# Patient Record
Sex: Female | Born: 1980 | Race: White | Hispanic: No | Smoking: Former smoker
Health system: Southern US, Community
[De-identification: ages and names within clinical notes are randomized; demographics above are authoritative.]

## PROBLEM LIST (undated history)

## (undated) DIAGNOSIS — R519 Headache, unspecified: Secondary | ICD-10-CM

## (undated) DIAGNOSIS — F32A Depression, unspecified: Secondary | ICD-10-CM

## (undated) DIAGNOSIS — F329 Major depressive disorder, single episode, unspecified: Secondary | ICD-10-CM

## (undated) DIAGNOSIS — R51 Headache: Secondary | ICD-10-CM

## (undated) DIAGNOSIS — Z8489 Family history of other specified conditions: Secondary | ICD-10-CM

## (undated) DIAGNOSIS — F419 Anxiety disorder, unspecified: Secondary | ICD-10-CM

## (undated) DIAGNOSIS — D649 Anemia, unspecified: Secondary | ICD-10-CM

## (undated) HISTORY — DX: Major depressive disorder, single episode, unspecified: F32.9

## (undated) HISTORY — DX: Depression, unspecified: F32.A

## (undated) HISTORY — DX: Anxiety disorder, unspecified: F41.9

## (undated) HISTORY — PX: SPINAL CORD STIMULATOR TRIAL: SHX5380

---

## 2002-09-10 ENCOUNTER — Emergency Department (HOSPITAL_COMMUNITY): Admission: EM | Admit: 2002-09-10 | Discharge: 2002-09-10 | Payer: Self-pay | Admitting: Emergency Medicine

## 2002-09-11 ENCOUNTER — Encounter: Payer: Self-pay | Admitting: Emergency Medicine

## 2002-09-15 ENCOUNTER — Ambulatory Visit: Admission: RE | Admit: 2002-09-15 | Discharge: 2002-09-15 | Payer: Self-pay | Admitting: Emergency Medicine

## 2002-09-24 ENCOUNTER — Ambulatory Visit (HOSPITAL_COMMUNITY): Admission: RE | Admit: 2002-09-24 | Discharge: 2002-09-24 | Payer: Self-pay | Admitting: Emergency Medicine

## 2002-12-31 ENCOUNTER — Other Ambulatory Visit: Admission: RE | Admit: 2002-12-31 | Discharge: 2002-12-31 | Payer: Self-pay | Admitting: Obstetrics & Gynecology

## 2003-05-21 ENCOUNTER — Inpatient Hospital Stay (HOSPITAL_COMMUNITY): Admission: AD | Admit: 2003-05-21 | Discharge: 2003-05-23 | Payer: Self-pay | Admitting: Obstetrics and Gynecology

## 2004-11-11 ENCOUNTER — Other Ambulatory Visit: Admission: RE | Admit: 2004-11-11 | Discharge: 2004-11-11 | Payer: Self-pay | Admitting: Obstetrics and Gynecology

## 2004-11-12 ENCOUNTER — Other Ambulatory Visit: Admission: RE | Admit: 2004-11-12 | Discharge: 2004-11-12 | Payer: Self-pay | Admitting: Obstetrics and Gynecology

## 2004-12-04 ENCOUNTER — Encounter: Admission: RE | Admit: 2004-12-04 | Discharge: 2004-12-04 | Payer: Self-pay | Admitting: Orthopedic Surgery

## 2004-12-06 ENCOUNTER — Ambulatory Visit (HOSPITAL_COMMUNITY): Admission: RE | Admit: 2004-12-06 | Discharge: 2004-12-06 | Payer: Self-pay | Admitting: Orthopedic Surgery

## 2004-12-24 ENCOUNTER — Encounter: Admission: RE | Admit: 2004-12-24 | Discharge: 2004-12-24 | Payer: Self-pay | Admitting: Orthopedic Surgery

## 2005-12-23 ENCOUNTER — Other Ambulatory Visit: Admission: RE | Admit: 2005-12-23 | Discharge: 2005-12-23 | Payer: Self-pay | Admitting: Obstetrics and Gynecology

## 2006-07-21 IMAGING — CT CT L SPINE W/ CM
3 of 7 series · 17 of 36 positions shown, 19 images · IV contrast (omnipaque)
Comparison: none

CLINICAL DATA: Low back and right lower extremity pain. No previous lumbar
surgery.

LUMBAR MYELOGRAM:
CT LUMBAR SPINE WITH INTRATHECAL CONTRAST:
TECHNIQUE: An appropriate entry site was determined under fluoroscopy. Skin site
was marked, prepped with Betadine, and draped in usual sterile fashion, and
infiltrated locally with 1% lidocaine. A 22 gauge spinal needle was  advanced
into the thecal sac at L3-L4 from a right interlaminar approach. . Clear
colorless CSF returned. 17 ml Omnipaque 180 were administered intrathecally for
lumbar myelography, followed by axial CT scanning of the lumbar spine. Coronal
and sagittal reconstructions were generated from the axial scan.

[Series 4: recon 3: l-spine helical · axial · 0.27mm/px · z∈[-53,+96]mm · 8 of 155 slices shown, 10 images]
[im 18/155  soft-tissue]
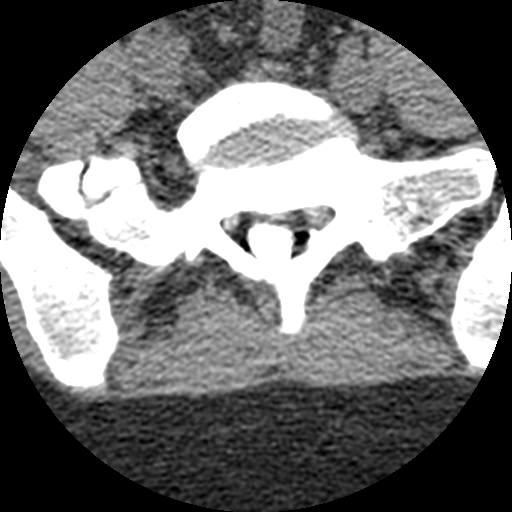
[im 18/155  bone]
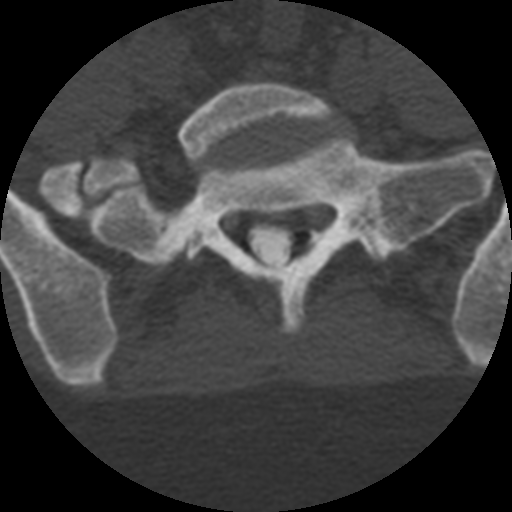
[im 35/155  bone]
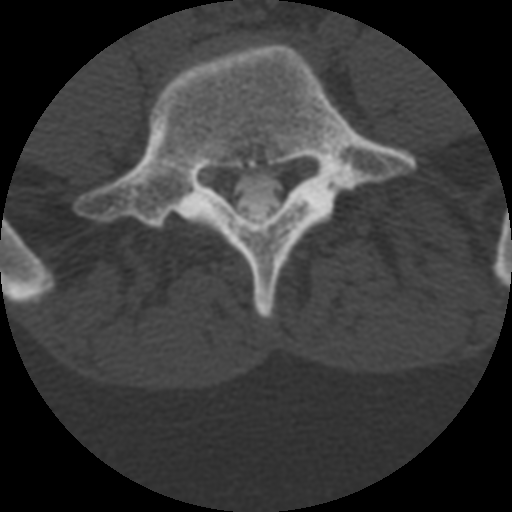
[im 52/155  bone]
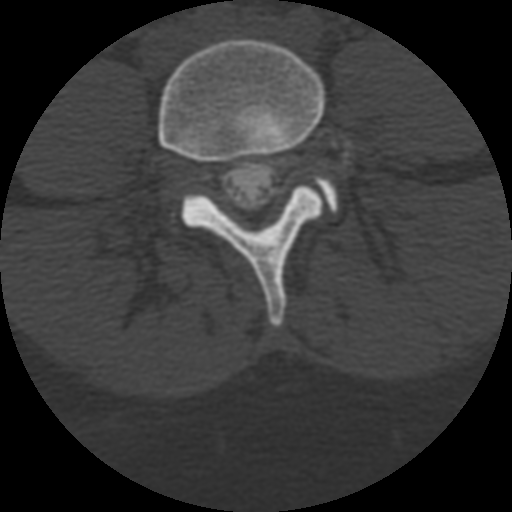
[im 69/155  bone]
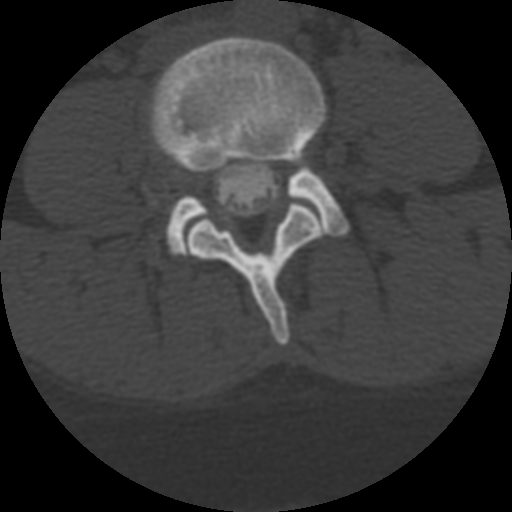
[im 86/155  soft-tissue]
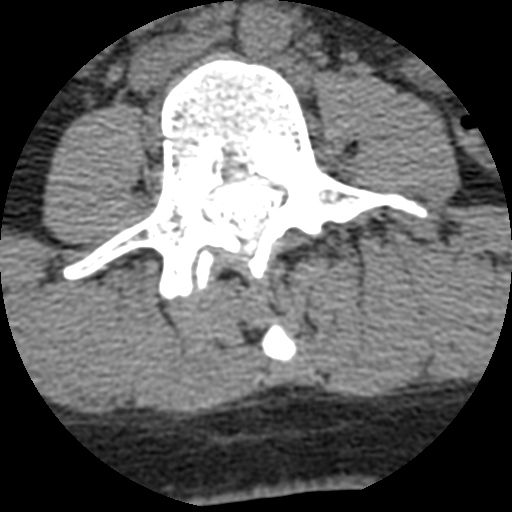
[im 86/155  bone]
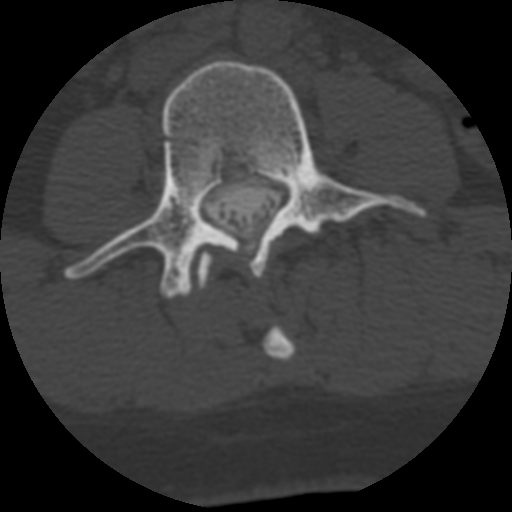
[im 103/155  bone]
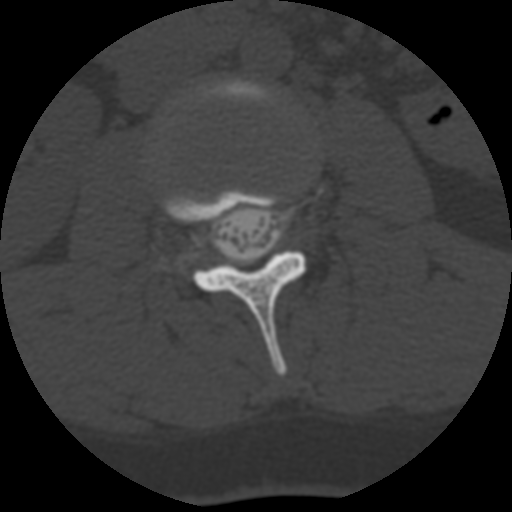
[im 120/155  bone]
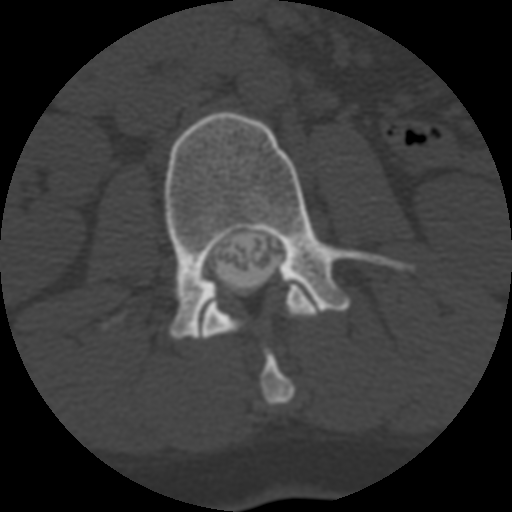
[im 137/155  bone]
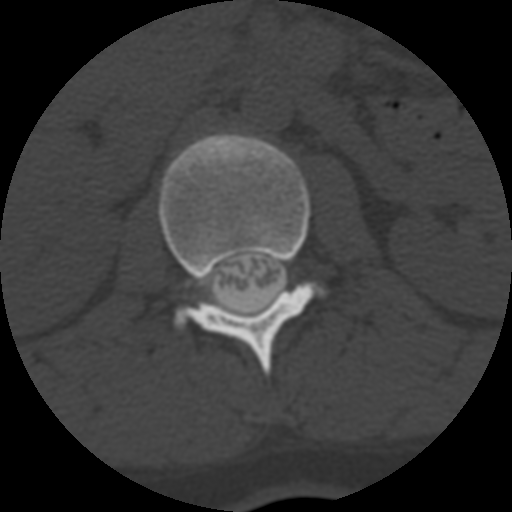

[Series 400: reformatted · sagittal · 0.39mm/px · 6 of 40 slices shown (1 of 2)]
[im 14/40  bone]
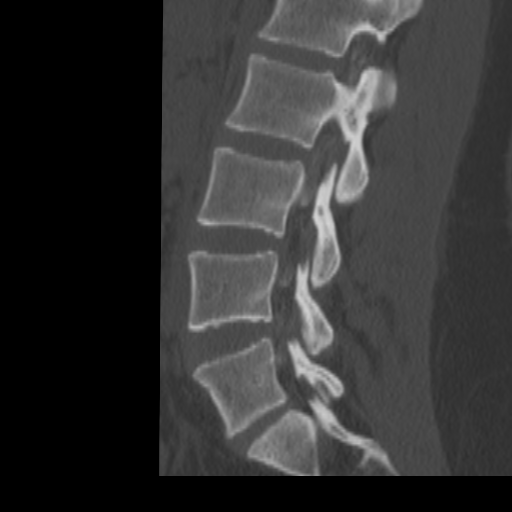
[im 17/40  bone]
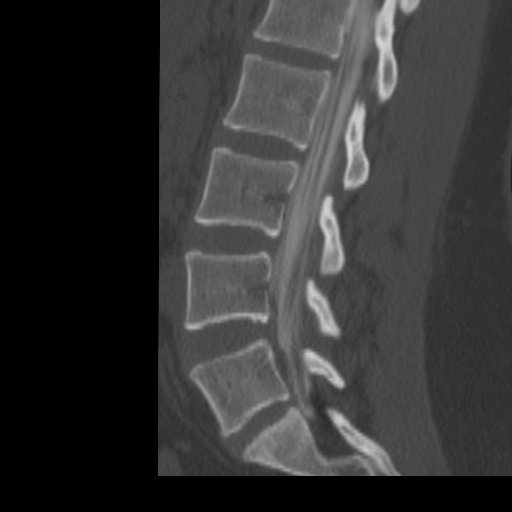
[im 20/40  bone]
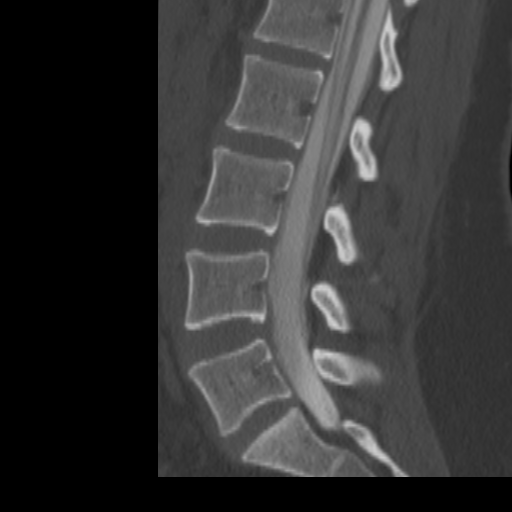
[im 23/40  bone]
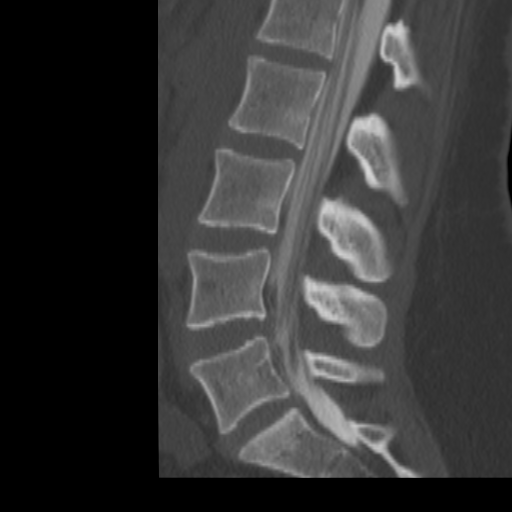
[im 27/40  bone]
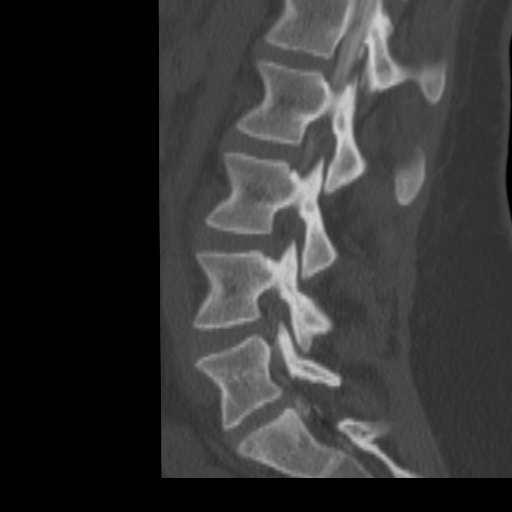
[im 29/40  soft-tissue]
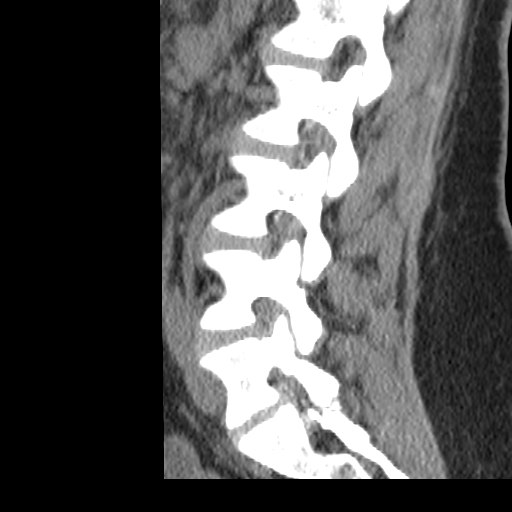

[Series 401: reformatted · coronal · 0.39mm/px · 3 of 40 slices shown (2 of 2)]
[im 8/40  bone]
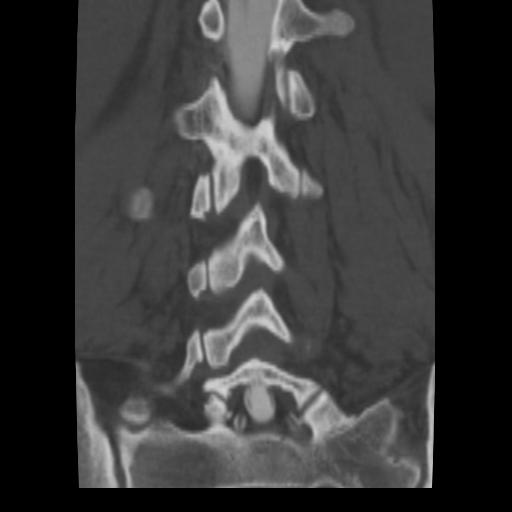
[im 16/40  bone]
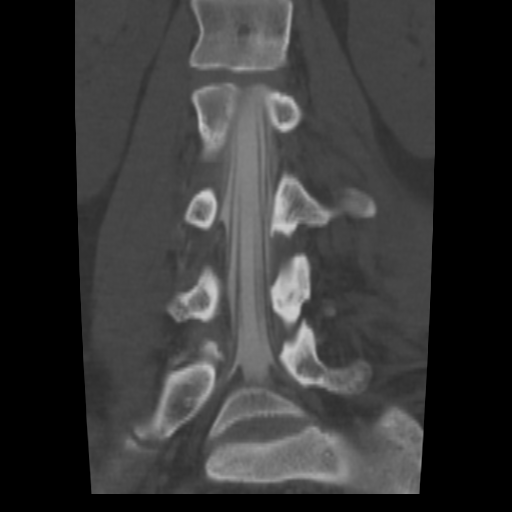
[im 24/40  bone]
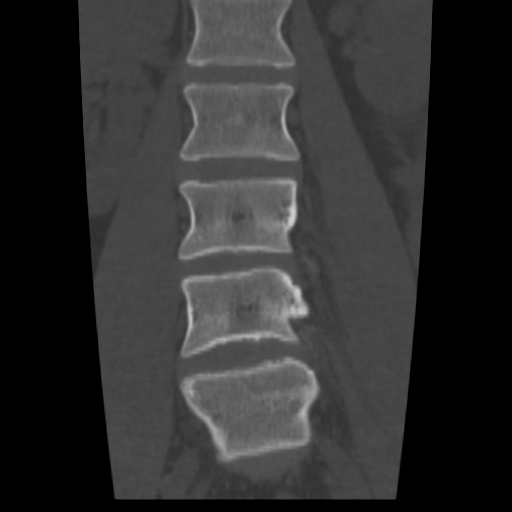

[17 of 36 positions shown; findings below may reference images not displayed]

FINDINGS: 5 nonrib-bearing lumbar segments labeled L1-L5.

T12-L1: Unremarkable

L1-L2: Normal conus terminates behind the L1 vertebral body. Negative for
protrusion, herniation, or stenosis.

L2-L3: Unremarkable

L3-L4: Unremarkable

L4-L5: Mild circumferential disc bulge. No spinal or neural foraminal stenosis.

L5-S1: Small left paracentral protrusion which touches but does not
significantly displace the left S1 nerve root sleeve. There is no nerve root cut
off. Mild degenerative spurring is noted at the inferior margin of the L5-S1
facets bilaterally.
IMPRESSION: 1. Mild bulge L4-L5 without evidence of compressive pathology.
2. Small left paracentral protrusion L5-S1 which contacts but does not displace
the left S1 nerve root sleeve.
3. Early facet degenerative changes bilaterally L5-S1

## 2006-07-23 IMAGING — RF DG FLUORO GUIDE NDL PLC/BX
2 series · 2 of 2 positions shown · non-contrast
Comparison: none

CLINICAL DATA: Positional headache and nausea persisting for 2 days post
myelogram despite conservative treatment.

[Series 1: run · 1 of 1 slices shown (1 of 2)]
[im 1/1]
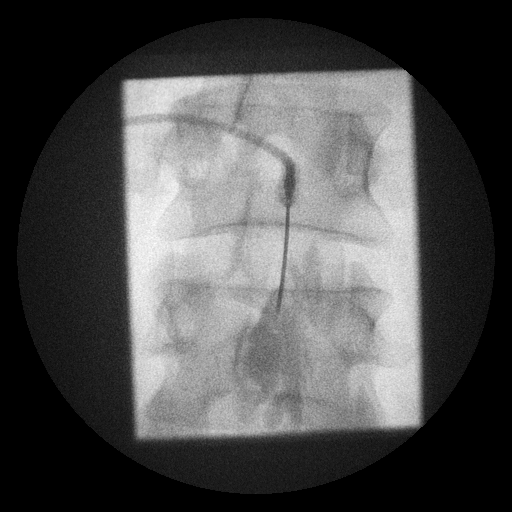

[Series 2: run · 1 of 1 slices shown (2 of 2)]
[im 1/1]
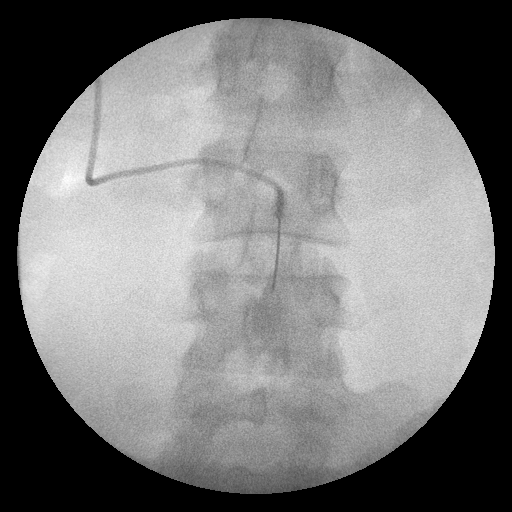

[2 of 2 positions shown; findings below may reference images not displayed]

Lumbar epidurogram and blood patch:

Overlying skin prepped with Betadine, draped in usual sterile fashion,
infiltrated locally with 1% lidocaine. 20 gauge spinal needle advanced into the
posterior epidural space on the right at L3-L4 using an interlaminar approach
with loss of resistance technique. Diagnostic injection of 3 ml 7mnipaque-0AM
opacifies the epidural space without intravascular or subarachnoid component. 15
ml autologous blood were administered as lumbar epidural blood patch. No
immediate complication.
IMPRESSION: 1. Technically successful lumbar epidural blood patch. The patient was counseled
on the  importance of lying flat for additional 24 hours and maintaining good
p.o. fluid intake. She was given a prescription for Vicodin  #24 1-2 p.o.
q.4-6h. p.r.n. pain, no refills.

## 2007-05-26 ENCOUNTER — Inpatient Hospital Stay (HOSPITAL_COMMUNITY): Admission: AD | Admit: 2007-05-26 | Discharge: 2007-05-26 | Payer: Self-pay | Admitting: Obstetrics and Gynecology

## 2007-05-27 ENCOUNTER — Inpatient Hospital Stay (HOSPITAL_COMMUNITY): Admission: AD | Admit: 2007-05-27 | Discharge: 2007-05-27 | Payer: Self-pay | Admitting: Obstetrics and Gynecology

## 2007-08-18 ENCOUNTER — Observation Stay (HOSPITAL_COMMUNITY): Admission: AD | Admit: 2007-08-18 | Discharge: 2007-08-19 | Payer: Self-pay | Admitting: Obstetrics and Gynecology

## 2007-11-09 ENCOUNTER — Ambulatory Visit (HOSPITAL_COMMUNITY): Admission: RE | Admit: 2007-11-09 | Discharge: 2007-11-09 | Payer: Self-pay | Admitting: Obstetrics and Gynecology

## 2008-01-29 ENCOUNTER — Inpatient Hospital Stay (HOSPITAL_COMMUNITY): Admission: AD | Admit: 2008-01-29 | Discharge: 2008-01-31 | Payer: Self-pay | Admitting: Obstetrics and Gynecology

## 2008-07-26 DIAGNOSIS — R112 Nausea with vomiting, unspecified: Secondary | ICD-10-CM | POA: Insufficient documentation

## 2008-07-31 ENCOUNTER — Ambulatory Visit: Payer: Self-pay | Admitting: Internal Medicine

## 2008-07-31 DIAGNOSIS — R1033 Periumbilical pain: Secondary | ICD-10-CM | POA: Insufficient documentation

## 2008-07-31 DIAGNOSIS — F319 Bipolar disorder, unspecified: Secondary | ICD-10-CM | POA: Insufficient documentation

## 2008-07-31 DIAGNOSIS — R197 Diarrhea, unspecified: Secondary | ICD-10-CM

## 2008-07-31 LAB — CONVERTED CEMR LAB
ALT: 19 units/L (ref 0–35)
Albumin: 3.7 g/dL (ref 3.5–5.2)
Alkaline Phosphatase: 67 units/L (ref 39–117)
Basophils Relative: 0.4 % (ref 0.0–3.0)
Calcium: 9.1 mg/dL (ref 8.4–10.5)
Chloride: 107 meq/L (ref 96–112)
Creatinine, Ser: 0.7 mg/dL (ref 0.4–1.2)
Eosinophils Relative: 1.3 % (ref 0.0–5.0)
GFR calc Af Amer: 132 mL/min
GFR calc non Af Amer: 109 mL/min
HCT: 34.5 % — ABNORMAL LOW (ref 36.0–46.0)
Neutro Abs: 3.9 10*3/uL (ref 1.4–7.7)
Neutrophils Relative %: 66.1 % (ref 43.0–77.0)
Platelets: 202 10*3/uL (ref 150–400)
Sed Rate: 27 mm/hr — ABNORMAL HIGH (ref 0–22)
Total Protein: 7.1 g/dL (ref 6.0–8.3)
Transferrin: 262.8 mg/dL (ref >=212.0)
WBC: 5.8 10*3/uL (ref 4.5–10.5)

## 2008-08-02 ENCOUNTER — Encounter: Payer: Self-pay | Admitting: Internal Medicine

## 2008-08-06 ENCOUNTER — Encounter: Payer: Self-pay | Admitting: Internal Medicine

## 2010-04-03 ENCOUNTER — Emergency Department (HOSPITAL_COMMUNITY): Admission: EM | Admit: 2010-04-03 | Discharge: 2010-04-04 | Payer: Self-pay | Admitting: Emergency Medicine

## 2010-10-27 NOTE — Assessment & Plan Note (Signed)
Summary: NAUSEA/DIARRHEA/FH   History of Present Illness Visit Type: self referral Primary GI MD: Lina Sar MD Primary Provider: Dr. Carmie Kanner Requesting Provider: n/a Chief Complaint: abdominal pain,nausea and vomiting has been on and off for about 9 years History of Present Illness:   This is a 30 year old white female referred by her mother who is a patient of ours  with Crohn's disease,  for evaluation of diarrhea and abdominal pain. The patient has been complaining of intermittent central abdominal pain for the past 9 years at times associated with diarrhea. She denies constipation. There has been occasional rectal bleeding. Her weight has been excessive. In spite of having abdominal pain, she has not missed any work. She takes Imodium p.r.n. Patient has diagnosis of bipolar disorder by her psychiatrist Dr Evelene Croon  in [place] and most recently in [place]. She was put on Seroquel but has not taken it because it makes her sleepy. Patient denies aphthous stomatitis, low back pain, history of gallbladder disease. She has never seen a gastroenterologist.   GI Review of Systems    Reports abdominal pain, acid reflux, belching, bloating, chest pain, loss of appetite, nausea, vomiting, weight loss, and  weight gain.      Denies dysphagia with liquids, dysphagia with solids, heartburn, and  vomiting blood.      Reports diarrhea.     Denies anal fissure, black tarry stools, change in bowel habit, constipation, diverticulosis, fecal incontinence, heme positive stool, hemorrhoids, irritable bowel syndrome, jaundice, light color stool, liver problems, rectal bleeding, and  rectal pain.     Prior Medications Reviewed Using: Patient Recall  Updated Prior Medication List: * BIRTH CONTROL 1 tablet once daily for 28 days  Current Allergies (reviewed today): No known allergies   Past Medical History:    Reviewed history from 07/26/2008 and no changes required:       Current Problems:   NAUSEA AND VOMITING (ICD-787.01)       Anemia       Anxiety Disorder       Asthma       Chronic Headaches       Depression       Hypertension       Sleep Apnea  Past Surgical History:    Reviewed history and no changes required:       Unremarkable   Family History:    Reviewed history and no changes required:       Family History of Colon Polyps: mother, grandfather       Family History of Colitis/Crohn's: Mother       Family History of Irritable Bowel Syndrome:Mother       Family History of Colon Cancer: ? Not Sure  Social History:    Reviewed history and no changes required:       Occupation: Production designer, theatre/television/film       Patient is a former smoker.  Quit 2008       Daily Caffeine Use       Illicit Drug Use - no   Risk Factors:  Tobacco use:  quit Drug use:  no    Vital Signs:  Patient Profile:   30 Years Old Female Height:     67 inches Weight:      221.38 pounds BMI:     34.80 Pulse rate:   88 / minute Pulse rhythm:   regular BP sitting:   122 / 82  (left arm)  Vitals Entered By: Merri Ray CMA (July 31, 2008 10:35 AM)  Physical Exam  General:     obese, alert, oriented and cooperative. Mouth:     No deformity or lesions, dentition normal. Neck:     Supple; no masses or thyromegaly. Lungs:     Clear throughout to auscultation. Heart:     Regular rate and rhythm; no murmurs, rubs,  or bruits. Abdomen:     large abdomen with striea . Normal active bowel sounds, diffusely tender; more so in the left and right lower quadrants. Liver edge at costal margin, no epigastric tenderness. No palpable mass. Rectal:     patient reluctantly agreed to rectal exam. Perianal area with normal rectal tone, no stool specimen obtained, mucous was hemoccult negative. Additional Exam:     she had multiple tattoos on the presacral area and upper extremities    Impression & Recommendations:  Problem # 1:  ABDOMINAL PAIN, PERIUMBILICAL  (ICD-789.05) intermittent chronic periumbilical abdominal pain associated with diarrhea on the backgroung of a family history of Crohn's disease in her mother who is our patient. She has issues with anxiety and depression but is on no medications. Her symptoms may be related to severe irritable bowel syndrome or possibly due to inflammatory bowel disease. I've had a discussion with her about the possibility of her having Crohn's disease. She agrees to undergo a workup although she is somewhat reluctant. I believe her mother made her come to see me against her wishes because of the concern for Crohn's disease. We will proceed with a CT scan of the abdomen and pelvis with IV and oral contrast and put  her on a trial of Bentyl 20 mg p.r.n. abdominal pain. We will obtain labs. Orders: CT Abdomen/Pelvis w & w/o Contrast (CT A/P w&w/o Cont) TLB-CBC Platelet - w/Differential (85025-CBCD) TLB-IBC Pnl (Iron/FE;Transferrin) (83550-IBC) TLB-CMP (Comprehensive Metabolic Pnl) (80053-COMP) TLB-Sedimentation Rate (ESR) (85651-ESR)   Problem # 2:  BIPOLAR DISORDER UNSPECIFIED (ICD-296.80) pt not taking prescribed medication Seroquel   Patient Instructions: 1)  CT scan of the abdomen and pelvis 2)  Bentyl 20 mg p.o. b.i.d. 3)  CBC, CMET, sedimentation rate, iron studies today 4)  Copy Sent To:Dr Irvin 5)   consider colonoscopy depending on CT scan results    Prescriptions: BENTYL 20 MG TABS (DICYCLOMINE HCL) Take 1 tablet by mouth two times a day  #60 x 3   Entered by:   Hortense Ramal CMA   Authorized by:   Hart Carwin MD   Signed by:   Hortense Ramal CMA on 07/31/2008   Method used:   Electronically to        [facility]. [id number]* (retail)       [address]       [place], [place]  [address]       Ph: [phone] or [phone]       Fax: [phone]   RxID:   [id number]  ]

## 2010-12-13 LAB — BASIC METABOLIC PANEL
CO2: 28 mEq/L (ref 19–32)
Calcium: 9 mg/dL (ref 8.4–10.5)
Chloride: 103 mEq/L (ref 96–112)
Creatinine, Ser: 0.78 mg/dL (ref 0.4–1.2)
GFR calc Af Amer: >60 mL/min (ref >60)
GFR calc non Af Amer: >60 mL/min (ref >60)
Glucose, Bld: 93 mg/dL (ref 70–99)
Sodium: 137 mEq/L (ref 135–145)

## 2010-12-13 LAB — POCT CARDIAC MARKERS
CKMB, poc: <1 ng/mL — ABNORMAL LOW (ref 1.0–8.0)
Myoglobin, poc: 32.8 ng/mL (ref 12–200)
Troponin i, poc: <0.05 ng/mL (ref 0.00–0.09)

## 2010-12-13 LAB — CBC
HCT: 40.4 % (ref 36.0–46.0)
MCH: 30.4 pg (ref 26.0–34.0)
MCV: 90.8 fL (ref 78.0–100.0)
Platelets: 199 10*3/uL (ref 150–400)
WBC: 6.7 10*3/uL (ref 4.0–10.5)

## 2010-12-13 LAB — DIFFERENTIAL
Basophils Relative: 0 % (ref 0–1)
Eosinophils Relative: 1 % (ref 0–5)
Lymphocytes Relative: 22 % (ref 12–46)

## 2010-12-13 LAB — URINALYSIS, ROUTINE W REFLEX MICROSCOPIC
Bilirubin Urine: NEGATIVE
Hgb urine dipstick: NEGATIVE
Specific Gravity, Urine: 1.028 (ref 1.005–1.030)
Urobilinogen, UA: 0.2 mg/dL (ref 0.0–1.0)

## 2010-12-13 LAB — POCT PREGNANCY, URINE: Preg Test, Ur: NEGATIVE

## 2010-12-13 LAB — D-DIMER, QUANTITATIVE: D-Dimer, Quant: 0.39 ug/mL-FEU (ref 0.00–0.48)

## 2011-01-25 ENCOUNTER — Other Ambulatory Visit: Payer: Self-pay | Admitting: Orthopedic Surgery

## 2011-01-25 ENCOUNTER — Ambulatory Visit
Admission: RE | Admit: 2011-01-25 | Discharge: 2011-01-25 | Disposition: A | Discharge status: Home / Self Care | Payer: Self-pay | Source: Ambulatory Visit | Attending: Orthopedic Surgery | Admitting: Orthopedic Surgery

## 2011-01-25 DIAGNOSIS — M545 Low back pain, unspecified: Secondary | ICD-10-CM

## 2011-01-25 DIAGNOSIS — M542 Cervicalgia: Secondary | ICD-10-CM

## 2011-01-25 DIAGNOSIS — T1490XA Injury, unspecified, initial encounter: Secondary | ICD-10-CM

## 2011-02-12 NOTE — Discharge Summary (Signed)
[name], [name]               ACCOUNT NO.:  [id number]   MEDICAL RECORD NO.:  [id number]          PATIENT TYPE:  OBV   LOCATION:  9305                          FACILITY:  [facility]   PHYSICIAN:  Carrington Clamp, M.D. DATE OF BIRTH:  1981/07/06   DATE OF ADMISSION:  08/18/2007  DATE OF DISCHARGE:  08/19/2007                               DISCHARGE SUMMARY   FINAL DIAGNOSES:  1. Intrauterine gestation at 18 weeks.  2. Hyperemesis and dehydration.   COMPLICATIONS:  None.   This 24-hour-old, G2, P1-0-0-1 presents at 18 weeks with persistent  nausea and vomiting.  The patient has been on oral Zofran and Phenergan,  still having problems at this time.  No urinary complaints.  Upon exam,  fetal heart tones were present, about 158 beats per minute.  Labs were  obtained.  No ketones or protein in her urine.  She was admitted because  there was no room in the MAU for some fluids.  The patient also was  noted to have a low sodium which was repleted.  She responded to some  Phenergan was started on some Macrobid for a mild urinary tract  infection.  The patient was feeling better by hospital day #1.  She was  felt ready for discharge, was [place] to tolerate a Brat diet, was sent  home with her Phenergan and Zofran and to use as directed with Fioricet  1 to 2 every 4 hours as needed for her headache, to continue her  vitamins and Prozac as she was taking prior.  She was also to continue  the rest of her Macrobid 100 mg twice a day and some Pepcid OTC to help  with some of the reflux.  She is to call our office of course with any  problems and to follow up with her routine office visit.   DISCHARGE LABORATORY DATA:  The patient had a hemoglobin of 11.1, white  blood cell count of 6.6.  She did have a mild urinary tract infection  that was treated with the Macrobid.      Vicki Shelton, P.A.-C.      Carrington Clamp, M.D.  Electronically Signed    MB/MEDQ  D:  09/15/2007  T:   09/16/2007  Job:  [id number]

## 2011-07-06 LAB — BASIC METABOLIC PANEL
CO2: 25
Calcium: 8 — ABNORMAL LOW
GFR calc Af Amer: >60
GFR calc non Af Amer: >60
Glucose, Bld: 93

## 2011-07-06 LAB — URINE MICROSCOPIC-ADD ON

## 2011-07-06 LAB — COMPREHENSIVE METABOLIC PANEL
Albumin: 3.1 — ABNORMAL LOW
BUN: 7
CO2: 25
GFR calc Af Amer: >60
GFR calc non Af Amer: >60
Glucose, Bld: 97

## 2011-07-06 LAB — CBC
MCHC: 34.8
Platelets: 192
RBC: 3.63 — ABNORMAL LOW
RDW: 14
WBC: 8.2

## 2011-07-06 LAB — URINALYSIS, ROUTINE W REFLEX MICROSCOPIC
Bilirubin Urine: NEGATIVE
Nitrite: NEGATIVE

## 2011-07-09 LAB — URINALYSIS, ROUTINE W REFLEX MICROSCOPIC
Bilirubin Urine: NEGATIVE
Ketones, ur: NEGATIVE
Leukocytes, UA: NEGATIVE
Nitrite: NEGATIVE
Specific Gravity, Urine: 1.015
Urobilinogen, UA: 1

## 2011-07-09 LAB — CBC
HCT: 35.1 — ABNORMAL LOW
MCV: 88.2
Platelets: 238
RBC: 3.98
RDW: 13.4
WBC: 6.6

## 2011-07-09 LAB — URINE MICROSCOPIC-ADD ON

## 2011-07-09 LAB — ABO/RH: ABO/RH(D): B POS

## 2011-07-09 LAB — RPR: RPR Ser Ql: NONREACTIVE

## 2011-07-09 LAB — POCT PREGNANCY, URINE: Operator id: 140111

## 2014-08-02 ENCOUNTER — Encounter: Payer: Self-pay | Admitting: Physical Medicine & Rehabilitation

## 2014-08-02 ENCOUNTER — Encounter
Payer: Worker's Compensation | Attending: Physical Medicine & Rehabilitation | Admitting: Physical Medicine & Rehabilitation

## 2014-08-02 VITALS — BP 138/76 | HR 76 | Resp 14 | Ht 66.0 in | Wt 209.0 lb

## 2014-08-02 DIAGNOSIS — M5481 Occipital neuralgia: Secondary | ICD-10-CM

## 2014-08-02 DIAGNOSIS — M47816 Spondylosis without myelopathy or radiculopathy, lumbar region: Secondary | ICD-10-CM

## 2014-08-02 NOTE — Progress Notes (Signed)
Subjective:    Shelton ID: Vicki Shelton, female    DOB: 01/30/1981, 33 y.o.   MRN: [id number]   IME Shelton: Vicki Shelton Date of Birth: 02/09/1981 Date of Exam and Report: 08/02/2014     HPI History of Present Illness: Ms. Vicki Shelton is a 33 year old white female who was injured on 01/08/11 while working for [employer]. The Shelton states that while she was unloading stock off a pallet, the stock/pallet fell back on her, and she was driven backwards, ultimately pinned between two pallets. She presented to [facility] in [place], [place] that afternoon and complained of pain and tingling in her mid back. Dr. Wyline Copasonald Peeler treated her and noted that her lumbar flexion was decreased but notes no tenderness, deformities, or other positive findings. Right hip xray was read as negative.  He diagnosed her with a lumbar sprain, and she was given Vicodin, Robaxin, and Diclofenac. The Shelton reports that she had mild headache at this time, but that the headache was mild in comparison to the back pain she was having.  Three days later, the Shelton returned to [facility] for a follow up visit as recommended by Dr. Marca AnconaPeeler. At this visit, she complained of neck pain, mid and low back pain, as well as weakness in her right arm. She was diagnosed with "cervico thoracic lumbar pain, right arm/hand neuropathy, right/arm hand weakness, right leg/foot neuropathy. She was given a medrol dose pack in addition to vicodin and robaxin. A referral was [facility] to [facility].   On 01/25/11, the Shelton was seen by Dr. Venita Lickahari Shelton. He notes that the Shelton was thrown backwards when she was lowering a pallet and felt a "snap in both the neck and the low back." Pain was noted with axial compression and traction. Weakness and sensory loss was noted in the right arm. Straight leg testing was positive on the right. Given the Shelton's weakness in the right arm and leg, Dr. Shon BatonBrooks ordered an MRI  of the cervical and lumbar spine. The cervical MRI was read as unremarkable except for mild degenerative disk disease with central protrusions and disk-osteophyte complexes at C5-6 and C6-7.  The lumbar MRI was read as unremarkable except for a transitional L5 vertebra. No central or foraminal stenosis was noted in the lumbar spine.  Dr. Shon BatonBrooks saw the Shelton back on 02/26/11 and recommended non-surgical intervention. He ordered a medrol dose pack, mobic, norco, and PT.   The Shelton returned to Dr. Shon BatonBrooks on 03/30/11 without improvement in pain. Pain was reported as significant in the low back, right buttocks, and right leg. Given the transitional L5 vertebra, he felt that this might be the source of her low back and right leg pain and ordered an injection of the pseudoarticulation between L5 and the sacrum on the right. On 05/03/11, the Shelton complained of a 3 week history of increasing headaches.  Low back pain was similar. FABER test was reported positive.   On 06/16/11, the Shelton saw Dr. Ethelene Halamos who injected the right sided pseudoarticulation, and he also performed bilateral greater occipital nerve blocks. The lumbar injection provided no relief, but the Shelton reported improvement in her headaches after the GON blocks.   Dr. Shon BatonBrooks saw the Shelton again on 06/29/2011 at which time he recommended pain management and an FCE. The FCE was performed on 08/04/2011, findings of which "suggested that Vicki Shelton' subjective reports of pain and associated limitation to be unreliable and inaccurate. Due  to the Shelton's self-limiting behaviors during the tests, it should be taken into consideration that she may be capable of performing at a higher level than the results suggest."  Sedentary work restrictions were recommended.  Dr. Shon Baton "put her into a light work category" and gave her a 3% impairment rating. He stated that there was nothing further he could for the Shelton at that point.   From that point  forward, Dr. Ethelene Hal has been the Shelton's main provider and has taken over the "pain management" of Vicki Shelton. Numerous blocks have been performed including repeat greater occipital nerve blocks, neural prolotherapy along the cervical sensory nerves, right L4-5 LESI, caudal block, right L3,L4 MBB with right L5 dorsal ramus block, RF neurotomy at same levels of MBB/dorsal ramus blocks, and trigger point injections.  As a whole, the Shelton appears to have experienced fair/temporary results with the lumbar interventions while the cervical/occipital injections were more beneficial, but still temporary in effect. None of these interventions provided long term significant relief, and the Shelton continues to complain of substantial pain. She has physical therapy which provided equivocal benefit.   The last notes I have from orthopedics are from April 05, 2014. She has seen Ramos since then however, including a visit for Lumbar RF's (performed in September) which provided some relief.  She may have had a trigger point injection also. Dr. Ethelene Hal has deferred any further interventions for her neck/headaches to Dr. Asa Lente whom he referred the Shelton to back in July.   A follow up MRI was ultimately performed on 05/11/14 which revealed no substantial changes from the initial MRI from 2012.    Regarding her current symptoms and presentation:   Low back:  her pain is worst with standing. The pain is most prominent from her right low back to her mid sacrum. She can stand for about 10 minutes before it sets in, and she usually has to sit down within 30 minutes. It will also bother if she sits in the car for a prolonged period of time. Additionally, it's difficult to stand after she sits. Sleeping is difficult. Typically she finds a prone position with a pillow as the most comfortable position to sleep in. Her sleep is broken and 4-6 hours per night. For stretching she does flexion exercises, lunges,  and other  ROM activities which seem to help. She tries to do these daily. She also likes to walk. She can walk for about a 1/2 mile before her back starts to hurt.  Currently she rates her back pain at a 5/10. She uses norco and alleve to help relieve her back pain. She tries to use just alleve on the good days but uses up two of the norco for days when the pain is bad.   Headaches and cervicalgia: Her headaches have gradually increased over the last 3 years. They were not too bad initially, and she states her back was more of a focus. The headaches seem to start in her upper neck and radiate over the occipital region into the posterior parietal area. The pain is more severe on the right side. She also experiences discomfort behind her eyes. The headaches usually take place on a daily basis and will last for a few hours. She does report mild photosensitivity, but it's not severe. She denies any other associated symptoms. She denies aura or warning signs that a headache is coming on. There has been no pattern to the timing of these headaches. This morning she woke  up with a headache. Her headache is rated a 5-6/10 this morning. To relieve a headache, she takes extra strength excedrin and then lays down--- typically 30-45 minutes later she has relief.    Pain Inventory Average Pain 7 Pain Right Now 5 My pain is intermittent, sharp, stabbing and aching  In the last 24 hours, has pain interfered with the following? General activity 10 Relation with others 10 Enjoyment of life 10 What TIME of day is your pain at its worst? any time Sleep (in general) Poor  Pain is worse with: unsure Pain improves with: rest, medication and injections Relief from Meds: 3  Mobility walk without assistance ability to climb steps?  yes do you drive?  yes  Function not employed: date last employed 01/08/2011  Neuro/Psych weakness numbness tingling trouble walking dizziness depression  Prior Studies Any changes  since last visit?  no  Physicians involved in your care Any changes since last visit?  no   Family History  Problem Relation Age of Onset  . Heart disease Paternal Grandmother    History   Social History  . Marital Status: Divorced    Spouse Name: N/A    Number of Children: N/A  . Years of Education: N/A   Social History Main Topics  . Smoking status: Former Smoker -- 1.00 packs/day    Types: Cigarettes  . Smokeless tobacco: None  . Alcohol Use: 0.0 oz/week    0 Not specified, 0 Glasses of wine per week  . Drug Use: None  . Sexual Activity: None   Other Topics Concern  . None   Social History Narrative  . None   History reviewed. No pertinent past surgical history. Past Medical History  Diagnosis Date  . Depression   . Anxiety    BP 138/76 mmHg  Pulse 76  Resp 14  Ht 5\' 6"  (1.676 m)  Wt 209 lb (94.802 kg)  BMI 33.75 kg/m2  SpO2 100%  Opioid Risk Score:   Fall Risk Score: Low Fall Risk (0-5 points)  Review of Systems  HENT: Negative for dental problem.   Cardiovascular: Negative for chest pain.  Gastrointestinal: Negative for abdominal pain and abdominal distention.  Endocrine: Negative for cold intolerance and heat intolerance.  Musculoskeletal: Positive for myalgias, back pain, gait problem, neck pain and neck stiffness.  Neurological: Positive for dizziness, weakness and headaches.  Psychiatric/Behavioral: Positive for dysphoric mood. The Shelton is nervous/anxious.        Objective:   Physical Exam  General: Alert and oriented x 3, No apparent distress. She's slightly overweight. HEENT: Head is normocephalic, atraumatic, PERRLA, EOMI, sclera anicteric, oral mucosa pink and moist, dentition good, ext ear canals clear,  Neck: Supple without JVD or lymphadenopathy Heart: Reg rate  Chest: normal respirations, no distress Abdomen: Soft, non-tender, non-distended Extremities: No clubbing, cyanosis, or edema.  Skin: Clean and intact without signs of  breakdown Neuro: Pt is cognitively appropriate with normal insight, memory, and awareness. Cranial nerves 2-12 are intact. Sensory exam is normal. Reflexes are 2+ in all 4's. Fine motor coordination is intact. No tremors. Motor function is grossly 5/5 in the UE's. RLE was 4- to 4+/5 with HF, KE/KF with some give away weakness and inconsistency noted. Right HAD was 4+, HAB 4 with some variability. Right ADF/APF 4+ to 5/5. LLE was grossly 5/5 proximal to distal.   Musculoskeletal: Full   AROM and PROM in the neck. Right lateral pending seemed to irritate her right occipital scalp and superior cervical  region. She had some discomfort with rightward rotation of the neck also. Other cervical movements proved less provocative from a pain standpoint. She experienced pain with palpation over the posterior neck 1-2 inches right of midline just below and the occipital ridge. There was additional tenderness above the ridge as well.  I also noted tenderness on the left in these areas, but to a much lesser extent. Spurling's test was negative on either side. Axial loading and decompression did not appear to exacerbate her pain. Shoulder, neck and head posture were fairly appropriate. No spasm or myofascial abnormalities were detected in the neck or shoulder girdles. Rotator cuff testing was normal.  From a standpoint of her low back, she was notable for slight elevation of her right hemipelvis which corrected with verbal cueing. She walked with antalgia on the right leg--she stated that weight bearing caused her upper leg and low back/buttock area to be tender. On palpation she was notable for prominence of the right lumbar paraspinal musculature with slight rotation of the spine in a clockwise manner (to the right, 5 degrees). The right lumbar paraspinals were tender from L3 down to the iliac crest. Both PSIS's were tender to palpation, especially on the right. Greater trochs were non-tender. Right SLR was notable for pain  provocation in the right low back predominantly with pain also in the posterior leg due to hamstring tightness.  Left SLR provoked a mild increase in her low back pain. Hamstrings were tight once again. FABER test was negative bilaterally (and actually "felt good" per Shelton). Seated slump test caused increased low back pain on the right, not as much on the left. She was able to bend to about 85 degrees in forward flexion at the waist before pain and tightness prohibitited her from going farther. She had difficulty returning to a neutral standing position after bending---needed extra time to achieve.  She has mild pain with lateral bending to the right more than left. Lumbar rotation did not appear provoke pain. Extension caused right low back discomfort but didn't appear to be overly limited. Lumbar Facet maneuver was negative on the left and positive on the right, reproducing her right low back pain. No obvious scoliosis was appreciated. Her hips, knees, and feet were normal in respect to color, temperature, ROM. There were no obvious signs of symptom exaggeration during any portion of the examination.   Psych: Pt's affect is appropriate. Pt is cooperative. She did not appear depressed.         Assessment & Recommendations:    1. Diagnoses: A. Greater Occipital Neuralgia, right more affected than left. B. 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 Causality: It is entirely plausible that both of these diagnoses are related to the injury on 12/29/2010 given the mechanism of the injury where she was thrown backwards and pinned between two pallets---thus causing impact and whiplash-like trauma to her head/neck and low back.  The Shelton reports pain in both areas after the injury even though her headaches were not noted by the MD at the time of the initial urgent care evaluation. Even though her lumbar imaging is not overwhelming for pathology, her  exam is consistent, and I saw no signs of symptom exaggeration. With facet arthropathy, it is not uncommon for an MRI to appear "normal" while clinically the Shelton displays facet signs.    3. Appropriateness of Care: Although I have some recommendations (as I will state below), Vicki Shelton'  care has been appropriate for her given diagnoses.  4. Additional recommendations for treatment:  Lumbar facet arthropathy/low back pain:  A. A focused trial of Pilates-based physical therapy to help reduce core muscle spasm, lengthen core musculature, strengthen core musculature, and to develop a customized home exercise program which the Shelton needs to follow through with on a daily basis. Shelton needs to practice fundamental Pilates principles on a daily basis as they pertain to her posture and technique. B. Scheduled trial of a muscle relaxant for at least a period of 2-3 months to reduce spasm. C. Scheduled prescription NSAID for at least a period of 2-3 months. D. Follow up lumbar RF's as appropriate  Occipital Neuralgia: 1. Trial of tricyclic antidepressant such as amitriptyline or nortriptyline 2. Trial of anticonvulsant such as topamax, gabapentin, or depakote 3. Scheduled NSAID trial for 2-3 months. Re-trial of oral steroids? 4. Physical therapy to address cervical/scalp massage, desensitization, modalities for pain relief 5. TENS unit trial 6. It would appropriate to see a headache specialist such as Dr. Vela Prose for further recommendations and intervention given his expertise in this area.   5./6. Functional Capacity Evaluation Issues: The Shelton's FCE findings were inconsistent. It is difficult to assign work restrictions given that fact. I would want more concrete findings before I determined a Shelton's readiness to return to work.   In this case, a repeat functional capacity evaluation would ultimately be useful, but not before the steps in #4 are taken. If the Shelton reaches a  plateau regarding her functional or pain-related progress after these interventions, then an FCE would be in order.  7. Maximum Medical Improvement Given the treatment recommendations I have made, I DO NOT feel this Shelton is yet at MMI. If the above treatment recommendations are followed and the Shelton is at a "steady state" of sorts, then MMI could potentially be declared.   8. Medications: I have essentially reviewed this above. She is really only taking over-the-counter NSAID's and norco for days when the pain is severe. I would change her to a prescription, scheduled NSAID along with the other medication types I mentioned above.  I don't think using hydrocodone for severe, breakthrough pain is inappropriate at this point---she is only using it on a limited basis.    Sincerely,     Ranelle Oyster, MD, [facility] 08/02/2014

## 2014-08-09 ENCOUNTER — Encounter: Payer: Self-pay | Admitting: Physical Medicine & Rehabilitation

## 2014-08-09 NOTE — Progress Notes (Signed)
Vicki Shelton A.k.a Vicki Shelton was seen by Dr. Riley KillSwartz for IME only. She is not a current patient. The IME report will be released upon receipt of payment. Please forward all correspondence regarding the IME with Dr. Riley KillSwartz to me. Thank you.

## 2016-05-17 ENCOUNTER — Other Ambulatory Visit: Payer: Self-pay | Admitting: Physical Medicine and Rehabilitation

## 2016-05-17 DIAGNOSIS — M5417 Radiculopathy, lumbosacral region: Secondary | ICD-10-CM

## 2016-05-18 ENCOUNTER — Encounter (HOSPITAL_COMMUNITY): Payer: Self-pay | Admitting: Radiology

## 2016-05-18 ENCOUNTER — Ambulatory Visit (HOSPITAL_COMMUNITY)
Admission: RE | Admit: 2016-05-18 | Discharge: 2016-05-18 | Disposition: A | Discharge status: Home / Self Care | Payer: Medicaid Other | Source: Ambulatory Visit | Attending: Physical Medicine and Rehabilitation | Admitting: Physical Medicine and Rehabilitation

## 2016-05-18 DIAGNOSIS — M5137 Other intervertebral disc degeneration, lumbosacral region: Secondary | ICD-10-CM | POA: Insufficient documentation

## 2016-05-18 DIAGNOSIS — M5417 Radiculopathy, lumbosacral region: Secondary | ICD-10-CM

## 2016-05-18 DIAGNOSIS — M5416 Radiculopathy, lumbar region: Secondary | ICD-10-CM | POA: Insufficient documentation

## 2016-05-18 DIAGNOSIS — M5136 Other intervertebral disc degeneration, lumbar region: Secondary | ICD-10-CM | POA: Diagnosis not present

## 2016-12-02 ENCOUNTER — Ambulatory Visit: Payer: Self-pay | Admitting: Physician Assistant

## 2016-12-09 ENCOUNTER — Inpatient Hospital Stay (HOSPITAL_COMMUNITY): Admission: RE | Admit: 2016-12-09 | Payer: Self-pay | Source: Ambulatory Visit

## 2016-12-16 ENCOUNTER — Ambulatory Visit: Admit: 2016-12-16 | Payer: Medicaid Other | Admitting: Orthopedic Surgery

## 2016-12-16 SURGERY — INSERTION, SPINAL CORD STIMULATOR, LUMBAR
Anesthesia: General

## 2018-03-31 ENCOUNTER — Encounter (HOSPITAL_COMMUNITY): Payer: Self-pay

## 2018-03-31 NOTE — Pre-Procedure Instructions (Signed)
Leata Mousenna E Kulikowski  03/31/2018    Your procedure is scheduled on Thursday, April 13, 2018 at 7:30 AM.   [facility] "A" Admitting Office at 5:30 AM.   Call this number if you have problems the morning of surgery: [phone]   Questions prior to day of surgery, please call [phone] between 8 & 4 PM.   Remember:  Do not eat or drink after midnight Wednesday, 04/12/18.  Take these medicines the morning of surgery with A SIP OF WATER: Hydrocodone - if needed  Do not use NSAIDS (Ibuprofen, Aleve, etc) or Aspirin Products (BC Powder, Goody's, etc) 7 days prior to surgery.    Do not wear jewelry, make-up or nail polish.  Do not wear lotions, powders, perfumes or deodorant.  Do not shave 48 hours prior to surgery.    Do not bring valuables to the hospital.  [facility] is not responsible for any belongings or valuables.  Contacts, dentures or bridgework may not be worn into surgery.  Leave your suitcase in the car.  After surgery it may be brought to your room.  For patients admitted to the hospital, discharge time will be determined by your treatment team.  Patients discharged the day of surgery will not be allowed to drive home.   [place] - Preparing for Surgery  Before surgery, you can play an important role.  Because skin is not sterile, your skin needs to be as free of germs as possible.  You can reduce the number of germs on you skin by washing with CHG (chlorahexidine gluconate) soap before surgery.  CHG is an antiseptic cleaner which kills germs and bonds with the skin to continue killing germs even after washing.  Oral Hygiene is also important in reducing the risk of infection.  Remember to brush your teeth with your regular toothpaste the morning of surgery.  Please DO NOT use if you have an allergy to CHG or antibacterial soaps.  If your skin becomes reddened/irritated stop using the CHG and inform your nurse when you arrive at Short Stay.  Do  not shave (including legs and underarms) for at least 48 hours prior to the first CHG shower.  You may shave your face.  Please follow these instructions carefully:   1.  Shower with CHG Soap the night before surgery and the morning of Surgery.  2.  If you choose to wash your hair, wash your hair first as usual with your normal shampoo.  3.  After you shampoo, rinse your hair and body thoroughly to remove the shampoo. 4.  Use CHG as you would any other liquid soap.  You can apply chg directly to the skin and wash gently with a      scrungie or washcloth.           5.  Apply the CHG Soap to your body ONLY FROM THE NECK DOWN.   Do not use on open wounds or open sores. Avoid contact with your eyes, ears, mouth and genitals (private parts).  Wash genitals (private parts) with your normal soap.  6.  Wash thoroughly, paying special attention to the area where your surgery will be performed.  7.  Thoroughly rinse your body with warm water from the neck down.  8.  DO NOT shower/wash with your normal soap after using and rinsing off the CHG Soap.  9.  Pat yourself dry with a clean towel.  10.  Wear clean pajamas.            11.  Place clean sheets on your bed the night of your first shower and do not sleep with pets.  Day of Surgery  Shower as above. Do not apply any lotions/deodorants the morning of surgery.   Please wear clean clothes to the hospital. Remember to brush your teeth with toothpaste.   Please read over the fact sheets that you were given.

## 2018-04-03 ENCOUNTER — Encounter (HOSPITAL_COMMUNITY)
Admission: RE | Admit: 2018-04-03 | Discharge: 2018-04-03 | Disposition: A | Discharge status: Home / Self Care | Payer: BLUE CROSS/BLUE SHIELD | Source: Ambulatory Visit | Attending: Orthopedic Surgery | Admitting: Orthopedic Surgery

## 2018-04-03 ENCOUNTER — Encounter (HOSPITAL_COMMUNITY): Payer: Self-pay

## 2018-04-03 ENCOUNTER — Other Ambulatory Visit: Payer: Self-pay

## 2018-04-03 DIAGNOSIS — Z6837 Body mass index (BMI) 37.0-37.9, adult: Secondary | ICD-10-CM | POA: Diagnosis not present

## 2018-04-03 DIAGNOSIS — Z01818 Encounter for other preprocedural examination: Secondary | ICD-10-CM | POA: Insufficient documentation

## 2018-04-03 DIAGNOSIS — E669 Obesity, unspecified: Secondary | ICD-10-CM | POA: Diagnosis not present

## 2018-04-03 DIAGNOSIS — D649 Anemia, unspecified: Secondary | ICD-10-CM | POA: Diagnosis not present

## 2018-04-03 DIAGNOSIS — R079 Chest pain, unspecified: Secondary | ICD-10-CM | POA: Insufficient documentation

## 2018-04-03 DIAGNOSIS — G894 Chronic pain syndrome: Secondary | ICD-10-CM | POA: Insufficient documentation

## 2018-04-03 DIAGNOSIS — F419 Anxiety disorder, unspecified: Secondary | ICD-10-CM | POA: Insufficient documentation

## 2018-04-03 DIAGNOSIS — F329 Major depressive disorder, single episode, unspecified: Secondary | ICD-10-CM | POA: Diagnosis not present

## 2018-04-03 HISTORY — DX: Anemia, unspecified: D64.9

## 2018-04-03 HISTORY — DX: Family history of other specified conditions: Z84.89

## 2018-04-03 HISTORY — DX: Headache: R51

## 2018-04-03 HISTORY — DX: Headache, unspecified: R51.9

## 2018-04-03 LAB — CBC
HCT: 39.6 % (ref 36.0–46.0)
HEMOGLOBIN: 12.4 g/dL (ref 12.0–15.0)
MCH: 29.5 pg (ref 26.0–34.0)
MCHC: 31.3 g/dL (ref 30.0–36.0)
MCV: 94.3 fL (ref 78.0–100.0)
PLATELETS: 245 10*3/uL (ref 150–400)
RBC: 4.2 MIL/uL (ref 3.87–5.11)
RDW: 12.9 % (ref 11.5–15.5)
WBC: 6.6 10*3/uL (ref 4.0–10.5)

## 2018-04-03 LAB — SURGICAL PCR SCREEN
MRSA, PCR: NEGATIVE
STAPHYLOCOCCUS AUREUS: NEGATIVE

## 2018-04-03 NOTE — H&P (Addendum)
Patient ID: Vicki Shelton MRN: [id number] DOB/AGE: 35/05/1981 35 y.o.  Admit date: (Not on file)  Admission Diagnoses:  Chronic Pain Syndrome  HPI: The patient is here today for a pre-operative History and Physical. They are scheduled for Spinal Cord Stimulator on 04-13-18 with Dr. Shon BatonBrooks   At [facility]. The pt has stopped smoking 2 days ago. She reports she smoke a pack over several days previously.   The pt reports a hx of good health.  Past Medical History: Past Medical History:  Diagnosis Date  . Anxiety   . Depression     Surgical History: No past surgical history on file.  Family History: Family History  Problem Relation Age of Onset  . Heart disease Paternal Grandmother     Social History: Social History   Socioeconomic History  . Marital status: Divorced    Spouse name: Not on file  . Number of children: Not on file  . Years of education: Not on file  . Highest education level: Not on file  Occupational History  . Not on file  Social Needs  . Financial resource strain: Not on file  . Food insecurity:    Worry: Not on file    Inability: Not on file  . Transportation needs:    Medical: Not on file    Non-medical: Not on file  Tobacco Use  . Smoking status: Former Smoker    Packs/day: 1.00    Types: Cigarettes  Substance and Sexual Activity  . Alcohol use: Yes    Alcohol/week: 0.0 oz  . Drug use: Not on file  . Sexual activity: Not on file  Lifestyle  . Physical activity:    Days per week: Not on file    Minutes per session: Not on file  . Stress: Not on file  Relationships  . Social connections:    Talks on phone: Not on file    Gets together: Not on file    Attends religious service: Not on file    Active member of club or organization: Not on file    Attends meetings of clubs or organizations: Not on file    Relationship status: Not on file  . Intimate partner violence:    Fear of current or ex partner: Not on file   Emotionally abused: Not on file    Physically abused: Not on file    Forced sexual activity: Not on file  Other Topics Concern  . Not on file  Social History Narrative  . Not on file    Allergies: Patient has no known allergies.  Medications: I have reviewed the patient's current medications.  Vital Signs: No data found.  Radiology: No results found.  Labs: No results for input(s): WBC, RBC, HCT, PLT in the last 72 hours. No results for input(s): NA, K, CL, CO2, BUN, CREATININE, GLUCOSE, CALCIUM in the last 72 hours. No results for input(s): LABPT, INR in the last 72 hours.  Review of Systems: ROS  Physical Exam: There is no height or weight on file to calculate BMI.  Physical Exam  Constitutional: She is oriented to person, place, and time. She appears well-developed and well-nourished.  HENT:  Head: Normocephalic.  Eyes: Pupils are equal, round, and reactive to light.  Neck: Normal range of motion.  Cardiovascular: Normal rate and regular rhythm.  Respiratory: Effort normal and breath sounds normal.  GI: Soft. Bowel sounds are normal.  Neurological: She is alert and oriented to person, place, and  time.  Skin: Skin is warm and dry.  Psychiatric: She has a normal mood and affect. Her behavior is normal. Judgment and thought content normal.  Gait pattern: Patient has pain when she arises from a seated position in the low back radiating into the right gluteal region and into the right thigh.  Gait pattern is slightly askew because of the right leg and buttock pain.  No imbalance.  Assistive devices: No assistive devices for ambulation  Neuro: Negative nerve root tension signs in the lower extremity.  5 out of 5 strength in the lower extremity.  Dysesthesias in the right leg diffusely.  Sensation to light touch is grossly intact in the lower extremities.  1+ symmetrical deep tendon reflexes at the knee and the Achilles bilaterally.  Negative Babinski test, no  clonus.  Musculoskeletal: Significant low back pain with palpation and range of motion (flexion, extension, rotation).  Pain and discomfort with palpation over the right SI joint, negative Patrick's test.  No significant hip, knee, ankle pain with isolated joint range of motion.  Thoracic MRI from 11/23/16 was reviewed.  Mild disc degeneration the mid thoracic spine but no significant stenosis.  Mild disc bulging C5-6 and C6-7.  No contraindications for placement of the spinal cord stimulator.     Assessment and Plan: Risks and benefits of surgery were discussed with the patient. These include: Infection, bleeding, death, stroke, paralysis, ongoing or worse pain, need for additional surgery, leak of spinal fluid, Failure of the battery requiring reoperation. Inability to place the paddle requiring the surgery to be aborted. Migration of the lead, failure to obtain results similar to the trial.  Goal of surgery: Reproduce pain relief obtained during the trial. Improved quality of life.  Anette Riedel, PAC for Vicki Lick, MD [facility] [phone]  Patient presents today for permanent spinal cord stimulator placement for chronic pain syndrome.  There is been no change in her clinical exam from her last office visit of 04/03/2018.  I have reviewed the surgical procedure with the patient and her family and have addressed all the risks and benefits and alternatives to surgery.  All of their questions have been addressed.  Plan is to move forward with a spinal cord stimulator placement via T10 laminotomy with a left ileal battery site insertion.

## 2018-04-03 NOTE — Progress Notes (Signed)
SPOKE WITH ANGELA KABBE ABOUT ORDER FOR CONSULT.  PATIENT HAD 1 EPISODE IN JAN 2019 OF CP (STRESS/ ECHO DONE) WAS NEGATIVE.  PATIENT STATED SHE HAS NOT HAD ANY CHEST PAIN SINCE. ANGELA DOES NOT NEED TO SEE PATIENT BUT WILL REVIEW CHART.  PCP- DR. Darcel BayleyLEONARD IN [place].

## 2018-04-03 NOTE — Progress Notes (Signed)
Anesthesia Chart Review:   Case:  [id number] Date/Time:  04/13/18 0715   Procedure:  LUMBAR SPINAL CORD STIMULATOR INSERTION (N/A ) - 2.5 hrs   Anesthesia type:  General   Pre-op diagnosis:  Chronic pain syndrome   Location:  [facility] ROOM 04 / [facility]   Surgeon:  Venita LickBrooks, Dahari, MD      DISCUSSION: - Pt is a 37 year old female with hx obesity, anemia.   - [facility] on 10/12/17 for intermittent chest pain x 1 week.  EKG without acute change, troponin normal.  Outpatient stress echo later performed, normal results below.    VS: BP 116/72 (BP Location: Left Arm)   Pulse 78   Temp 36.9 C (Oral)   Resp 18   Ht 5\' 8"  (1.727 m)   Wt 249 lb 3.2 oz (113 kg)   SpO2 99%   BMI 37.89 kg/m     PROVIDERS: Patient, No Pcp Per   LABS: Labs reviewed: Acceptable for surgery. (all labs ordered are listed, but only abnormal results are displayed)  Labs Reviewed  SURGICAL PCR SCREEN  CBC     IMAGES:  CTA chest 10/12/17 (care everywhere):  - Negative for acute pulmonary embolus or aortic dissection.  - Negative CT examination of the chest.  CXR 10/12/17 (care everywhere): No active cardiopulmonary disease.   EKG 10/13/17 ([facility]): report in care everywhere documents "sinus rhythm.  Tracing requested   CV:  Stress echo 10/25/17 (care everywhere):  - Functional capacity is normal for age/sex - 10 mets on the 2-min Bruce protocol. - Normal resting biventricular function (ejection fraction), with no resting segmental abnormality. - No clinical or echocardiographic ischemia (induced wall motion abnormality): - Negative stress echocardiogram   Past Medical History:  Diagnosis Date  . Anemia    HX BLOOD TRANSFUSION    . Anxiety   . Depression   . Family history of adverse reaction to anesthesia    MOTHER HAS  N+V  . Headache    HX MIGRAINES    Past Surgical History:  Procedure Laterality Date  . SPINAL CORD STIMULATOR TRIAL      MEDICATIONS: .  HYDROcodone-acetaminophen (NORCO) 10-325 MG tablet   No current facility-administered medications for this encounter.     If no changes, I anticipate pt can proceed with surgery as scheduled.   Rica Mastngela Maryama Kuriakose, FNP-BC [facility]est[facility]iology Phone: [phone] 04/04/2018 9:40 AM

## 2018-04-13 ENCOUNTER — Ambulatory Visit (HOSPITAL_COMMUNITY): Payer: BLUE CROSS/BLUE SHIELD

## 2018-04-13 ENCOUNTER — Other Ambulatory Visit: Payer: Self-pay

## 2018-04-13 ENCOUNTER — Observation Stay (HOSPITAL_COMMUNITY)
Admission: RE | Admit: 2018-04-13 | Discharge: 2018-04-14 | Disposition: A | Discharge status: Home / Self Care | Payer: BLUE CROSS/BLUE SHIELD | Source: Ambulatory Visit | Attending: Orthopedic Surgery | Admitting: Orthopedic Surgery

## 2018-04-13 ENCOUNTER — Encounter (HOSPITAL_COMMUNITY): Payer: Self-pay

## 2018-04-13 ENCOUNTER — Ambulatory Visit (HOSPITAL_COMMUNITY): Payer: BLUE CROSS/BLUE SHIELD | Admitting: Certified Registered"

## 2018-04-13 ENCOUNTER — Encounter (HOSPITAL_COMMUNITY)
Admission: RE | Disposition: A | Discharge status: Home / Self Care | Payer: Self-pay | Source: Ambulatory Visit | Attending: Orthopedic Surgery

## 2018-04-13 ENCOUNTER — Ambulatory Visit (HOSPITAL_COMMUNITY): Payer: BLUE CROSS/BLUE SHIELD | Admitting: Emergency Medicine

## 2018-04-13 DIAGNOSIS — G894 Chronic pain syndrome: Secondary | ICD-10-CM | POA: Diagnosis not present

## 2018-04-13 DIAGNOSIS — Z87891 Personal history of nicotine dependence: Secondary | ICD-10-CM | POA: Diagnosis not present

## 2018-04-13 DIAGNOSIS — Z9689 Presence of other specified functional implants: Secondary | ICD-10-CM

## 2018-04-13 DIAGNOSIS — F319 Bipolar disorder, unspecified: Secondary | ICD-10-CM | POA: Insufficient documentation

## 2018-04-13 DIAGNOSIS — F419 Anxiety disorder, unspecified: Secondary | ICD-10-CM | POA: Diagnosis not present

## 2018-04-13 DIAGNOSIS — Z419 Encounter for procedure for purposes other than remedying health state, unspecified: Secondary | ICD-10-CM

## 2018-04-13 DIAGNOSIS — Z9889 Other specified postprocedural states: Secondary | ICD-10-CM

## 2018-04-13 HISTORY — PX: SPINAL CORD STIMULATOR INSERTION: SHX5378

## 2018-04-13 LAB — POCT PREGNANCY, URINE: Preg Test, Ur: NEGATIVE

## 2018-04-13 SURGERY — INSERTION, SPINAL CORD STIMULATOR, LUMBAR
Anesthesia: General | Site: Spine Lumbar

## 2018-04-13 MED ORDER — HYDROMORPHONE HCL 1 MG/ML IJ SOLN
INTRAMUSCULAR | Status: AC
  Filled 2018-04-13: qty 1

## 2018-04-13 MED ORDER — DEXTROSE 5 % IV SOLN
500.0000 mg | Freq: Four times a day (QID) | INTRAVENOUS | Status: DC | PRN
  Filled 2018-04-13: qty 5

## 2018-04-13 MED ORDER — HYDROCODONE-ACETAMINOPHEN 10-325 MG PO TABS: 1.0000 | ORAL_TABLET | ORAL | 0 refills | Status: DC | PRN

## 2018-04-13 MED ORDER — POLYETHYLENE GLYCOL 3350 17 G PO PACK: 17.0000 g | PACK | Freq: Every day | ORAL | Status: DC | PRN

## 2018-04-13 MED ORDER — MIDAZOLAM HCL 2 MG/2ML IJ SOLN
INTRAMUSCULAR | Status: AC
  Filled 2018-04-13: qty 2

## 2018-04-13 MED ORDER — ONDANSETRON HCL 4 MG/2ML IJ SOLN
4.0000 mg | Freq: Once | INTRAMUSCULAR | Status: AC | PRN
  Administered 2018-04-13: 4 mg via INTRAVENOUS

## 2018-04-13 MED ORDER — PROPOFOL 10 MG/ML IV BOLUS
INTRAVENOUS | Status: DC | PRN
  Administered 2018-04-13: 100 mg via INTRAVENOUS

## 2018-04-13 MED ORDER — EPHEDRINE SULFATE 50 MG/ML IJ SOLN
INTRAMUSCULAR | Status: AC
Start: 2018-04-13 — End: ?
  Filled 2018-04-13: qty 1

## 2018-04-13 MED ORDER — SUGAMMADEX SODIUM 200 MG/2ML IV SOLN
INTRAVENOUS | Status: DC | PRN
  Administered 2018-04-13: 200 mg via INTRAVENOUS

## 2018-04-13 MED ORDER — HYDROMORPHONE HCL 1 MG/ML IJ SOLN
0.5000 mg | INTRAMUSCULAR | Status: AC | PRN
  Administered 2018-04-13 (×2): 0.5 mg via INTRAVENOUS

## 2018-04-13 MED ORDER — BUPIVACAINE-EPINEPHRINE 0.25% -1:200000 IJ SOLN
INTRAMUSCULAR | Status: DC | PRN
  Administered 2018-04-13: 20 mL

## 2018-04-13 MED ORDER — ROCURONIUM BROMIDE 10 MG/ML (PF) SYRINGE
PREFILLED_SYRINGE | INTRAVENOUS | Status: AC
  Filled 2018-04-13: qty 10

## 2018-04-13 MED ORDER — THROMBIN 20000 UNITS EX SOLR
CUTANEOUS | Status: DC | PRN
  Administered 2018-04-13: 20 mL via TOPICAL

## 2018-04-13 MED ORDER — FENTANYL CITRATE (PF) 100 MCG/2ML IJ SOLN
INTRAMUSCULAR | Status: DC | PRN
  Administered 2018-04-13: 50 ug via INTRAVENOUS
  Administered 2018-04-13: 100 ug via INTRAVENOUS
  Administered 2018-04-13: 50 ug via INTRAVENOUS

## 2018-04-13 MED ORDER — ACETAMINOPHEN 10 MG/ML IV SOLN
INTRAVENOUS | Status: AC
  Filled 2018-04-13: qty 100

## 2018-04-13 MED ORDER — ONDANSETRON 4 MG PO TBDP: 4.0000 mg | ORAL_TABLET | Freq: Three times a day (TID) | ORAL | 0 refills | Status: AC | PRN

## 2018-04-13 MED ORDER — HYDROMORPHONE HCL 1 MG/ML IJ SOLN
0.2500 mg | INTRAMUSCULAR | Status: DC | PRN
  Administered 2018-04-13 (×4): 0.5 mg via INTRAVENOUS

## 2018-04-13 MED ORDER — ACETAMINOPHEN 650 MG RE SUPP: 650.0000 mg | RECTAL | Status: DC | PRN

## 2018-04-13 MED ORDER — THROMBIN (RECOMBINANT) 20000 UNITS EX SOLR
CUTANEOUS | Status: AC
  Filled 2018-04-13: qty 20000

## 2018-04-13 MED ORDER — MAGNESIUM CITRATE PO SOLN: 1.0000 | Freq: Once | ORAL | Status: DC | PRN

## 2018-04-13 MED ORDER — CEFAZOLIN SODIUM-DEXTROSE 2-4 GM/100ML-% IV SOLN
2.0000 g | Freq: Three times a day (TID) | INTRAVENOUS | Status: AC
  Administered 2018-04-13 (×2): 2 g via INTRAVENOUS
  Filled 2018-04-13 (×2): qty 100

## 2018-04-13 MED ORDER — BUPIVACAINE-EPINEPHRINE (PF) 0.25% -1:200000 IJ SOLN
INTRAMUSCULAR | Status: AC
  Filled 2018-04-13: qty 30

## 2018-04-13 MED ORDER — LACTATED RINGERS IV SOLN
INTRAVENOUS | Status: DC
  Administered 2018-04-13: 14:00:00 via INTRAVENOUS

## 2018-04-13 MED ORDER — ONDANSETRON HCL 4 MG PO TABS: 4.0000 mg | ORAL_TABLET | Freq: Four times a day (QID) | ORAL | Status: DC | PRN

## 2018-04-13 MED ORDER — ARTIFICIAL TEARS OPHTHALMIC OINT
TOPICAL_OINTMENT | OPHTHALMIC | Status: AC
  Filled 2018-04-13: qty 3.5

## 2018-04-13 MED ORDER — DEXAMETHASONE SODIUM PHOSPHATE 10 MG/ML IJ SOLN
INTRAMUSCULAR | Status: AC
  Filled 2018-04-13: qty 1

## 2018-04-13 MED ORDER — PROPOFOL 10 MG/ML IV BOLUS
INTRAVENOUS | Status: AC
  Filled 2018-04-13: qty 20

## 2018-04-13 MED ORDER — MEPERIDINE HCL 50 MG/ML IJ SOLN: 6.2500 mg | INTRAMUSCULAR | Status: DC | PRN

## 2018-04-13 MED ORDER — SODIUM CHLORIDE 0.9% FLUSH: 3.0000 mL | INTRAVENOUS | Status: DC | PRN

## 2018-04-13 MED ORDER — HYDROCODONE-ACETAMINOPHEN 10-325 MG PO TABS
2.0000 | ORAL_TABLET | ORAL | Status: DC | PRN
  Administered 2018-04-13 – 2018-04-14 (×6): 2 via ORAL
  Filled 2018-04-13 (×6): qty 2

## 2018-04-13 MED ORDER — ACETAMINOPHEN 10 MG/ML IV SOLN
INTRAVENOUS | Status: DC | PRN
  Administered 2018-04-13: 1000 mg via INTRAVENOUS

## 2018-04-13 MED ORDER — ONDANSETRON HCL 4 MG/2ML IJ SOLN
INTRAMUSCULAR | Status: DC | PRN
Start: 2018-04-13 — End: 2018-04-13
  Administered 2018-04-13: 4 mg via INTRAVENOUS

## 2018-04-13 MED ORDER — METHOCARBAMOL 500 MG PO TABS
ORAL_TABLET | ORAL | Status: AC
  Filled 2018-04-13: qty 1

## 2018-04-13 MED ORDER — DEXAMETHASONE SODIUM PHOSPHATE 10 MG/ML IJ SOLN
INTRAMUSCULAR | Status: DC | PRN
  Administered 2018-04-13: 10 mg via INTRAVENOUS

## 2018-04-13 MED ORDER — DOCUSATE SODIUM 100 MG PO CAPS
100.0000 mg | ORAL_CAPSULE | Freq: Two times a day (BID) | ORAL | Status: DC
  Administered 2018-04-13 – 2018-04-14 (×2): 100 mg via ORAL
  Filled 2018-04-13 (×3): qty 1

## 2018-04-13 MED ORDER — HYDROXYZINE HCL 50 MG/ML IM SOLN
50.0000 mg | Freq: Four times a day (QID) | INTRAMUSCULAR | Status: DC | PRN
  Administered 2018-04-13: 50 mg via INTRAMUSCULAR
  Filled 2018-04-13: qty 1

## 2018-04-13 MED ORDER — HEMOSTATIC AGENTS (NO CHARGE) OPTIME
TOPICAL | Status: DC | PRN
  Administered 2018-04-13: 1 via TOPICAL

## 2018-04-13 MED ORDER — ARTIFICIAL TEARS OPHTHALMIC OINT
TOPICAL_OINTMENT | OPHTHALMIC | Status: DC | PRN
  Administered 2018-04-13: 1 via OPHTHALMIC

## 2018-04-13 MED ORDER — ONDANSETRON HCL 4 MG/2ML IJ SOLN: 4.0000 mg | Freq: Four times a day (QID) | INTRAMUSCULAR | Status: DC | PRN

## 2018-04-13 MED ORDER — CEFAZOLIN SODIUM-DEXTROSE 2-4 GM/100ML-% IV SOLN
INTRAVENOUS | Status: AC
  Filled 2018-04-13: qty 100

## 2018-04-13 MED ORDER — METHOCARBAMOL 500 MG PO TABS: 500.0000 mg | ORAL_TABLET | Freq: Three times a day (TID) | ORAL | 0 refills | Status: AC

## 2018-04-13 MED ORDER — HYDROCODONE-ACETAMINOPHEN 5-325 MG PO TABS: 1.0000 | ORAL_TABLET | ORAL | Status: DC | PRN

## 2018-04-13 MED ORDER — PHENOL 1.4 % MT LIQD: 1.0000 | OROMUCOSAL | Status: DC | PRN

## 2018-04-13 MED ORDER — SUGAMMADEX SODIUM 200 MG/2ML IV SOLN
INTRAVENOUS | Status: AC
  Filled 2018-04-13: qty 2

## 2018-04-13 MED ORDER — MIDAZOLAM HCL 2 MG/2ML IJ SOLN
INTRAMUSCULAR | Status: DC | PRN
  Administered 2018-04-13: 2 mg via INTRAVENOUS

## 2018-04-13 MED ORDER — ONDANSETRON HCL 4 MG/2ML IJ SOLN
INTRAMUSCULAR | Status: AC
  Filled 2018-04-13: qty 2

## 2018-04-13 MED ORDER — LIDOCAINE 2% (20 MG/ML) 5 ML SYRINGE
INTRAMUSCULAR | Status: AC
  Filled 2018-04-13: qty 5

## 2018-04-13 MED ORDER — MENTHOL 3 MG MT LOZG: 1.0000 | LOZENGE | OROMUCOSAL | Status: DC | PRN

## 2018-04-13 MED ORDER — 0.9 % SODIUM CHLORIDE (POUR BTL) OPTIME
TOPICAL | Status: DC | PRN
  Administered 2018-04-13: 1000 mL

## 2018-04-13 MED ORDER — SODIUM CHLORIDE 0.9 % IJ SOLN
INTRAMUSCULAR | Status: AC
  Filled 2018-04-13: qty 10

## 2018-04-13 MED ORDER — FENTANYL CITRATE (PF) 250 MCG/5ML IJ SOLN
INTRAMUSCULAR | Status: AC
  Filled 2018-04-13: qty 5

## 2018-04-13 MED ORDER — LACTATED RINGERS IV SOLN
INTRAVENOUS | Status: DC | PRN
  Administered 2018-04-13 (×2): via INTRAVENOUS

## 2018-04-13 MED ORDER — SODIUM CHLORIDE 0.9% FLUSH: 3.0000 mL | Freq: Two times a day (BID) | INTRAVENOUS | Status: DC

## 2018-04-13 MED ORDER — METHOCARBAMOL 500 MG PO TABS
500.0000 mg | ORAL_TABLET | Freq: Four times a day (QID) | ORAL | Status: DC | PRN
  Administered 2018-04-13 – 2018-04-14 (×3): 500 mg via ORAL
  Filled 2018-04-13 (×3): qty 1

## 2018-04-13 MED ORDER — LIDOCAINE 2% (20 MG/ML) 5 ML SYRINGE
INTRAMUSCULAR | Status: DC | PRN
  Administered 2018-04-13: 100 mg via INTRAVENOUS

## 2018-04-13 MED ORDER — ROCURONIUM BROMIDE 10 MG/ML (PF) SYRINGE
PREFILLED_SYRINGE | INTRAVENOUS | Status: DC | PRN
  Administered 2018-04-13 (×2): 10 mg via INTRAVENOUS
  Administered 2018-04-13: 40 mg via INTRAVENOUS

## 2018-04-13 MED ORDER — ACETAMINOPHEN 325 MG PO TABS: 650.0000 mg | ORAL_TABLET | ORAL | Status: DC | PRN

## 2018-04-13 MED ORDER — MORPHINE SULFATE (PF) 2 MG/ML IV SOLN
1.0000 mg | INTRAVENOUS | Status: DC | PRN
  Administered 2018-04-13: 1 mg via INTRAVENOUS
  Filled 2018-04-13: qty 1

## 2018-04-13 MED ORDER — CEFAZOLIN SODIUM-DEXTROSE 2-4 GM/100ML-% IV SOLN
2.0000 g | INTRAVENOUS | Status: AC
  Administered 2018-04-13: 2 g via INTRAVENOUS

## 2018-04-13 SURGICAL SUPPLY — 65 items
CANISTER SUCT 3000ML PPV (MISCELLANEOUS) ×3 IMPLANT
CLOSURE STERI-STRIP 1/2X4 (GAUZE/BANDAGES/DRESSINGS) ×1
CLSR STERI-STRIP ANTIMIC 1/2X4 (GAUZE/BANDAGES/DRESSINGS) ×2 IMPLANT
COVER MAYO STAND STRL (DRAPES) ×3 IMPLANT
COVER PROBE W GEL 5X96 (DRAPES) ×2 IMPLANT
COVER SURGICAL LIGHT HANDLE (MISCELLANEOUS) ×1 IMPLANT
DRAPE C-ARM 42X72 X-RAY (DRAPES) ×3 IMPLANT
DRAPE INCISE IOBAN 85X60 (DRAPES) ×3 IMPLANT
DRAPE SURG 17X23 STRL (DRAPES) ×3 IMPLANT
DRAPE U-SHAPE 47X51 STRL (DRAPES) ×3 IMPLANT
DRSG OPSITE POSTOP 4X6 (GAUZE/BANDAGES/DRESSINGS) ×5 IMPLANT
DURAPREP 26ML APPLICATOR (WOUND CARE) ×3 IMPLANT
ELECT BLADE 4.0 EZ CLEAN MEGAD (MISCELLANEOUS) ×3
ELECT CAUTERY BLADE 6.4 (BLADE) ×3 IMPLANT
ELECT PENCIL ROCKER SW 15FT (MISCELLANEOUS) ×3 IMPLANT
ELECT REM PT RETURN 9FT ADLT (ELECTROSURGICAL) ×3
ELECTRODE BLDE 4.0 EZ CLN MEGD (MISCELLANEOUS) IMPLANT
ELECTRODE REM PT RTRN 9FT ADLT (ELECTROSURGICAL) ×1 IMPLANT
FLOSEAL 5ML (HEMOSTASIS) ×2 IMPLANT
GENERATOR PULSE PROCLAIM 5ELIT (Neuro Prosthesis/Implant) IMPLANT
GLOVE BIO SURGEON STRL SZ7 (GLOVE) ×3 IMPLANT
GLOVE BIOGEL PI IND STRL 7.0 (GLOVE) ×1 IMPLANT
GLOVE BIOGEL PI IND STRL 7.5 (GLOVE) IMPLANT
GLOVE BIOGEL PI IND STRL 8.5 (GLOVE) ×1 IMPLANT
GLOVE BIOGEL PI INDICATOR 7.0 (GLOVE) ×4
GLOVE BIOGEL PI INDICATOR 7.5 (GLOVE) ×4
GLOVE BIOGEL PI INDICATOR 8.5 (GLOVE) ×2
GLOVE SS BIOGEL STRL SZ 8.5 (GLOVE) IMPLANT
GLOVE SS N UNI LF 8.5 STRL (GLOVE) ×6 IMPLANT
GLOVE SUPERSENSE BIOGEL SZ 8.5 (GLOVE) ×2
GOWN STRL REUS W/ TWL LRG LVL3 (GOWN DISPOSABLE) ×2 IMPLANT
GOWN STRL REUS W/ TWL XL LVL3 (GOWN DISPOSABLE) IMPLANT
GOWN STRL REUS W/TWL 2XL LVL3 (GOWN DISPOSABLE) ×3 IMPLANT
GOWN STRL REUS W/TWL LRG LVL3 (GOWN DISPOSABLE) ×6
GOWN STRL REUS W/TWL XL LVL3 (GOWN DISPOSABLE) ×3
KIT BASIN OR (CUSTOM PROCEDURE TRAY) ×3 IMPLANT
KIT TURNOVER KIT B (KITS) ×3 IMPLANT
LAMI NARROW PRIPOLE 16CH (Orthopedic Implant) ×2 IMPLANT
NDL SPNL 18GX3.5 QUINCKE PK (NEEDLE) ×1 IMPLANT
NEEDLE 22X1 1/2 (OR ONLY) (NEEDLE) ×3 IMPLANT
NEEDLE SPNL 18GX3.5 QUINCKE PK (NEEDLE) ×3 IMPLANT
NS IRRIG 1000ML POUR BTL (IV SOLUTION) ×3 IMPLANT
PACK LAMINECTOMY ORTHO (CUSTOM PROCEDURE TRAY) ×3 IMPLANT
PACK UNIVERSAL I (CUSTOM PROCEDURE TRAY) ×3 IMPLANT
PAD ARMBOARD 7.5X6 YLW CONV (MISCELLANEOUS) ×8 IMPLANT
PROCLAIM PROGRAMMER PATIENT (MISCELLANEOUS) ×2 IMPLANT
PULSE GENERATOR PROCLAIM 5ELIT (Neuro Prosthesis/Implant) ×3 IMPLANT
SPATULA SILICONE BRAIN 10MM (MISCELLANEOUS) ×2 IMPLANT
SPONGE LAP 4X18 RFD (DISPOSABLE) IMPLANT
SPONGE SURGIFOAM ABS GEL 100 (HEMOSTASIS) ×3 IMPLANT
STAPLER VISISTAT 35W (STAPLE) ×3 IMPLANT
SURGIFLO W/THROMBIN 8M KIT (HEMOSTASIS) ×1 IMPLANT
SUT BONE WAX W31G (SUTURE) ×3 IMPLANT
SUT ETHIBOND 2 OS 4 DA (SUTURE) ×3 IMPLANT
SUT MNCRL AB 3-0 PS2 18 (SUTURE) ×6 IMPLANT
SUT VIC AB 1 CT1 18XCR BRD 8 (SUTURE) ×2 IMPLANT
SUT VIC AB 1 CT1 8-18 (SUTURE) ×6
SUT VIC AB 2-0 CT1 18 (SUTURE) ×3 IMPLANT
SYR BULB IRRIGATION 50ML (SYRINGE) ×3 IMPLANT
SYR CONTROL 10ML LL (SYRINGE) ×3 IMPLANT
TOOL TUNNELING 20 INCH (ORTHOPEDIC DISPOSABLE SUPPLIES) ×2 IMPLANT
TOWEL OR 17X24 6PK STRL BLUE (TOWEL DISPOSABLE) ×3 IMPLANT
TOWEL OR 17X26 10 PK STRL BLUE (TOWEL DISPOSABLE) ×3 IMPLANT
WATER STERILE IRR 1000ML POUR (IV SOLUTION) ×3 IMPLANT
YANKAUER SUCT BULB TIP NO VENT (SUCTIONS) ×3 IMPLANT

## 2018-04-13 NOTE — Brief Op Note (Signed)
04/13/2018  9:43 AM  PATIENT:  Vicki Shelton  37 y.o. female  PRE-OPERATIVE DIAGNOSIS:  Chronic pain syndrome  POST-OPERATIVE DIAGNOSIS:  Chronic pain syndrome  PROCEDURE:  Procedure(s): LUMBAR SPINAL CORD STIMULATOR INSERTION (N/A)  SURGEON:  Surgeon(s) and Role:    Venita Lick* Slater Mcmanaman, MD - Primary  PHYSICIAN ASSISTANT:   ASSISTANTS: Anette Riedelarmen Mayo, PA   ANESTHESIA:   general  EBL:  minimal   BLOOD ADMINISTERED:none  DRAINS: none   LOCAL MEDICATIONS USED:  MARCAINE     SPECIMEN:  No Specimen  DISPOSITION OF SPECIMEN:  N/A  COUNTS:  YES  TOURNIQUET:  * No tourniquets in log *  DICTATION: .Dragon Dictation  PLAN OF CARE: Admit for overnight observation  PATIENT DISPOSITION:  PACU - hemodynamically stable.

## 2018-04-13 NOTE — Evaluation (Signed)
Physical Therapy Evaluation Patient Details Name: Vicki Shelton MRN: [id number] DOB: Apr 27, 1981 Today's Date: 04/13/2018   History of Present Illness  Zakyia Gagan is a 37 y.o. Fe s/p insertion of a lumbar spinal cord stimulator. PMH includes tobacco use, chornic pain, anxiety, and depression.  Clinical Impression  Pt presents with problems above and deficits below. Pt was educated on precautions, and was able to perform sit<>stand transfers at EOB and in the bathroom. Pt ambulated with a RW with MinG-MinA.  Pt was limited by pain throughout the session. Will continue to follow acutely to support independence, mobility, and safety.     Follow Up Recommendations No PT follow up    Equipment Recommendations  Rolling walker with 5" wheels    Recommendations for Other Services OT consult     Precautions / Restrictions Precautions Precautions: Back Precaution Booklet Issued: Yes (comment) Precaution Comments: Pt educated on precautions Restrictions Weight Bearing Restrictions: No      Mobility  Bed Mobility Overal bed mobility: Needs Assistance Bed Mobility: Rolling;Sidelying to Sit;Sit to Sidelying Rolling: Min guard Sidelying to sit: Min guard     Sit to sidelying: Min guard;Min assist General bed mobility comments: Pt needed cues to perform safe log rolls in bed, and extended time.  MinA was provided to help move legs during sit->sidelying  Transfers Overall transfer level: Needs assistance Equipment used: 1 person hand held assist Transfers: Sit to/from Stand Sit to Stand: Mod assist         General transfer comment: Pt had a hard time standing from a low surface, and used BUE on bed rails to stand.   Ambulation/Gait Ambulation/Gait assistance: Min assist;Min guard Gait Distance (Feet): 60 Feet Assistive device: Rolling walker (2 wheeled) Gait Pattern/deviations: Step-through pattern;Antalgic;Decreased stride length Gait velocity: Decreased   General Gait  Details: Pt required cues to stand up tall and education on safe use of RW. Pt was able to perform turns following precautions. Pt exhibited some signs of fatigue with ambulation, but reported no increase in pain. Pt was reliant on RW for balance during ambulation. Pt had no visible LOB during ambulation.  Stairs            Wheelchair Mobility    Modified Rankin (Stroke Patients Only)       Balance Overall balance assessment: Needs assistance Sitting-balance support: Bilateral upper extremity supported;Feet supported Sitting balance-Leahy Scale: Fair Sitting balance - Comments: pt was able to lift arms overhead while PT put on gait belt   Standing balance support: Single extremity supported;Bilateral upper extremity supported;No upper extremity supported Standing balance-Leahy Scale: Poor Standing balance comment: Pt sought out UE support in the enviornment, and was unsteady without UE support. Pt was likely guarding for pain. Pt had no visible LOB while standing. Pt able to perform static standing without UE to wash hands at sink.                             Pertinent Vitals/Pain Pain Assessment: 0-10 Pain Score: 10-Worst pain ever Pain Location: Back Pain Descriptors / Indicators: Grimacing;Guarding Pain Intervention(s): Limited activity within patient's tolerance;Monitored during session;Repositioned    Home Living Family/patient expects to be discharged to:: Private residence Living Arrangements: Alone Available Help at Discharge: Other (Comment)(Pt's mother offered help at home, but pt refused) Type of Home: Mobile home Home Access: Stairs to enter Entrance Stairs-Rails: Right;Left Entrance Stairs-Number of Steps: (Unspecified) Home Layout: One level Home Equipment: Toilet riser  Prior Function Level of Independence: Independent         Comments: Pt reports she lives alone, and was able to function with pain.      Hand Dominance   Dominant  Hand: Right    Extremity/Trunk Assessment   Upper Extremity Assessment Upper Extremity Assessment: Overall WFL for tasks assessed    Lower Extremity Assessment Lower Extremity Assessment: Overall WFL for tasks assessed    Cervical / Trunk Assessment Cervical / Trunk Assessment: Other exceptions(s/p spinal surgery)  Communication   Communication: No difficulties  Cognition Arousal/Alertness: Awake/alert Behavior During Therapy: Flat affect Overall Cognitive Status: Within Functional Limits for tasks assessed                                        General Comments General comments (skin integrity, edema, etc.): Pt's visitor reported to PT that pt would be "hornery," and promptly left. Pt's family visited after session, and the pt's mother insisted the pt would be going home with her. Pt refused.     Exercises     Assessment/Plan    PT Assessment Patient needs continued PT services  PT Problem List Decreased strength;Decreased range of motion;Decreased activity tolerance;Decreased balance;Decreased mobility;Decreased knowledge of use of DME;Decreased knowledge of precautions;Pain       PT Treatment Interventions DME instruction;Gait training;Stair training;Functional mobility training;Patient/family education;Therapeutic exercise;Therapeutic activities;Balance training    PT Goals (Current goals can be found in the Care Plan section)  Acute Rehab PT Goals Patient Stated Goal: To "get back to (my) bed" (at home) PT Goal Formulation: With patient Time For Goal Achievement: 04/27/18 Potential to Achieve Goals: Fair    Frequency Min 5X/week   Barriers to discharge        Co-evaluation               AM-PAC PT "6 Clicks" Daily Activity  Outcome Measure Difficulty turning over in bed (including adjusting bedclothes, sheets and blankets)?: Unable Difficulty moving from lying on back to sitting on the side of the bed? : Unable Difficulty sitting down  on and standing up from a chair with arms (e.g., wheelchair, bedside commode, etc,.)?: Unable Help needed moving to and from a bed to chair (including a wheelchair)?: A Little Help needed walking in hospital room?: A Little Help needed climbing 3-5 steps with a railing? : A Lot 6 Click Score: 11    End of Session Equipment Utilized During Treatment: Gait belt Activity Tolerance: Patient limited by pain Patient left: in bed;with call bell/phone within reach;with family/visitor present Nurse Communication: Mobility status PT Visit Diagnosis: Unsteadiness on feet (R26.81);Other abnormalities of gait and mobility (R26.89);Muscle weakness (generalized) (M62.81);Pain;Difficulty in walking, not elsewhere classified (R26.2) Pain - part of body: (Back)    Time: [id number] PT Time Calculation (min) (ACUTE ONLY): 35 min   Charges:   PT Evaluation $PT Eval Low Complexity: 1 Low PT Treatments $Gait Training: 8-22 mins   PT G Codes:       Demetria PoreJulia Kennet Mccort, S-DPT Acute Care Rehab Student [id number]   04/13/2018, 3:56 PM

## 2018-04-13 NOTE — Transfer of Care (Signed)
Immediate Anesthesia Transfer of Care Note  Patient: Vicki Shelton  Procedure(s) Performed: LUMBAR SPINAL CORD STIMULATOR INSERTION (N/A Spine Lumbar)  Patient Location: PACU  Anesthesia Type:General  Level of Consciousness: drowsy  Airway & Oxygen Therapy: Patient Spontanous Breathing and Patient connected to nasal cannula oxygen  Post-op Assessment: Report given to RN and Patient moving all extremities X 4  Post vital signs: Reviewed and stable  Last Vitals:  Vitals Value Taken Time  BP 127/86 04/13/2018 10:01 AM  Temp    Pulse 69 04/13/2018 10:02 AM  Resp 18 04/13/2018 10:02 AM  SpO2 98 % 04/13/2018 10:02 AM  Vitals shown include unvalidated device data.  Last Pain:  Vitals:   04/13/18 0605  PainSc: 6       Patients Stated Pain Goal: 3 (04/13/18 [id number])  Complications: No apparent anesthesia complications

## 2018-04-13 NOTE — Op Note (Signed)
Operative report  Preoperative diagnosis: Chronic pain syndrome, status post successful trial spinal cord stimulator placement  Postoperative diagnosis: Same  Operative procedure: Spinal cord stimulator placement  Implant: Abbott spine tri-pole stimulator paddle, proclaim nonrechargeable battery  First assistant: Anette Riedelarmen Mayo, PA  Indications: This is a very pleasant 37 year old woman who presented to my office after having a successful spinal cord stimulator trial for her chronic back buttock and neuropathic leg pain.  Attempts at conservative management had failed to alleviate her symptoms, and given the success of the trial she elected to move forward with a permanent implantation all appropriate risks benefits and alternatives were explained to the patient and her family and consent was obtained.  Operative report: Patient was brought the operating room placed upon the operating room table.  After successful induction of general anesthesia and endotracheally patient teds SCDs were applied and she was turned prone onto the Wilson frame.  All bony prominences are well-padded and the back was then prepped and draped in a standard fashion.  Timeout was taken to confirm patient procedure and all other important data.  X-ray was then used to identify the T10 pedicle and the incision was mapped out starting at the inferior aspect of the T10 pedicle down to the superior aspect of the T12 pedicle.  I infiltrated this incision site.  A midline incision was made spanning this space and sharp dissection was carried out down to the deep fascia.  The deep fascia was sharply incised and using electric cautery as well as a Cobb elevator strip the paraspinal muscles to expose the spinous process and lamina of T10-T11 and a portion of that of T12.  The majority of the programming was done for T9 vertebral body and so I elected to do a T10 laminotomy.  X-ray was used again to confirm the T10 pedicle and T10 spinous  process.  Once it was reconfirmed I then remove the inferior third of the spinous process of T10 to develop a plane underneath the T10 lamina and then used my 2 mm Kerrison to perform a laminotomy of T10.  Once an adequate bony resection I then used my Penfield for to dissect through the central raphae of the ligamentum flavum.  Once I created a plane between the thecal sac and the ligamentum flavum I use my 2 mm Kerrison Roger to resect the posterior ligamentum flavum.  And now had excellent visualization of the posterior aspect of the thecal sac.  The trial implant was then passed under the laminotomy site and superiorly up to the T8-9 disc space.  I was able to easily pass the trial.  Given the success and ease with which trial past I then obtained the tripolar lead and placed it.  I was able to gently advance it from the T10 laminotomy site superiorly so that it came to final position in the midportion of the T8-9 disc space.  It was central and slightly to the right per request of the spinal cord stimulator rep.  At this point the final position was confirmed with both the AP and lateral planes.  According to the representative from a spinal cord stimulator company this spanned the exact area where she was getting maximum coverage.  At this point with the paddle properly positioned I secured it directly to the T11 lamina using a #1 Ethibond suture.  Once it was secured directly to the spinous process I then wrapped around the T11 sinus process so that it would be secure.  A second incision was made in the left gluteal region and sharp dissection was carried out through the deep subcutaneous tissue and fascia.  I then created a pocket approximately 2-1/2 cm deep to place the battery.  Once the it was ready I then used the submuscular wire passer to pass the leads from the thoracic wound to the gluteal wound.  At this point I secured it directly to the battery.  The battery was then tested and the leads were  functioning fine.  I then placed the battery into the pocket and secured into the deep fascia with two #1 Vicryl sutures.  I again tested the battery now that was in the patient and again there was no complicating features.  At this point time repeat final x-rays were taken to confirm that there was no migration of the lead and it was still properly positioned.  Once this was done all wounds were copiously irrigated with normal saline.  Prior to placing the battery I did make sure hemostasis using bipolar electrocautery and FloSeal once the battery was on the field and the patient the electrocautery was detached so that we would not inadvertently damaged the battery.  At this point all wounds were copiously irrigated with normal saline and closed in a layered fashion with interrupted #1 Vicryl suture, 2-0 Vicryl suture, and 3-0 Monocryl for the skin.  Steri-Strips and dry dressings were applied and the patient was ultimately extubated transfer the PACU without incident.  The end of the case all needle sponge counts were correct.  No complications throughout the procedure.

## 2018-04-13 NOTE — Anesthesia Postprocedure Evaluation (Signed)
Anesthesia Post Note  Patient: Leata Mousenna E Gittings  Procedure(s) Performed: LUMBAR SPINAL CORD STIMULATOR INSERTION (N/A Spine Lumbar)     Patient location during evaluation: PACU Anesthesia Type: General Level of consciousness: awake and alert Pain management: pain level controlled Vital Signs Assessment: post-procedure vital signs reviewed and stable Respiratory status: spontaneous breathing, nonlabored ventilation, respiratory function stable and patient connected to nasal cannula oxygen Cardiovascular status: blood pressure returned to baseline and stable Postop Assessment: no apparent nausea or vomiting Anesthetic complications: no    Last Vitals:  Vitals:   04/13/18 1145 04/13/18 1606  BP: 119/64 133/71  Pulse: 63 71  Resp: 18 16  Temp: 36.4 C 36.7 C  SpO2: 96% 99%    Last Pain:  Vitals:   04/13/18 1615  TempSrc:   PainSc: 6                  Wania Longstreth DAVID

## 2018-04-13 NOTE — Discharge Instructions (Signed)
Lumbar Diskectomy, Care After °Refer to this sheet in the next few weeks. These instructions provide you with information about caring for yourself after your procedure. Your health care provider may also give you more specific instructions. Your treatment has been planned according to current medical practices, but problems sometimes occur. Call your health care provider if you have any problems or questions after your procedure. °What can I expect after the procedure? °After the procedure, it is common to have: °· Pain. °· Numbness. °· Weakness. ° °Follow these instructions at home: °Medicines °· Take medicines only as directed by your health care provider. °· If you were prescribed an antibiotic medicine, finish all of it even if you start to feel better. °Incision care °· There are many different ways to close and cover an incision, including stitches (sutures), skin glue, and adhesive strips. Follow your health care provider's instructions about: °? Incision care. °? Bandage (dressing) changes and removal. °? Incision closure removal. °· Check your incision area every day for signs of infection. If you cannot see your incision, have someone check it for you. Watch for: °? Redness, swelling, or pain. °? Fluid, blood, or pus. °Activity °· Avoid sitting for longer than 20 minutes at a time or as directed by your health care provider. °· Do not climb stairs more than once each day until your health care provider approves. °· Do not bend at your waist. To pick things up, bend your knees. °· Do not lift anything that is heavier than 10 lb (4.5 kg) or as directed by your health care provider. °· Do not drive a car until your health care provider approves. °· Ask your health care provider when you may return to your normal activities, such as playing sports and going back to work. °· Work with your physical therapist to learn safe movement and exercises to help healing. Do these exercises as directed. °· Take short  walks often. °General instructions °· Do not use any tobacco products, including cigarettes, chewing tobacco, or electronic cigarettes. If you need help quitting, ask your health care provider. °· Follow your health care provider’s instructions about bathing. Do not take baths, shower, swim, or use a hot tub until your health care provider approves. °· Wear your back brace as directed by your health care provider. °· To prevent constipation: °? Drink enough fluid to keep your urine clear or pale yellow. °? Eat plenty of fruits, vegetables, and whole grains. °· Keep all follow-up visits as directed by your health care provider. This is important. This includes any follow-up visits with your physical therapist. °Contact a health care provider if: °· You have a fever. °· You have redness, swelling, or pain in your incision area. °· Your pain is not controlled with medicine. °· You have pain, numbness, or weakness that lasts longer than three weeks after surgery. °· You become constipated. °Get help right away if: °· You have fluid, blood, or pus coming from your incision. °· You have increasing pain, numbness, or weakness. °· You lose control of when you urinate or have a bowel movement (incontinence). °· You have chest pain. °· You have trouble breathing. °This information is not intended to replace advice given to you by your health care provider. Make sure you discuss any questions you have with your health care provider. °Document Released: 08/18/2004 Document Revised: 02/19/2016 Document Reviewed: 05/08/2014 °Elsevier Interactive Patient Education © 2018 [employer] ° °

## 2018-04-13 NOTE — Anesthesia Preprocedure Evaluation (Signed)
Anesthesia Evaluation  Patient identified by MRN, date of birth, ID band Patient awake    Reviewed: Allergy & Precautions, NPO status , Patient's Chart, lab work & pertinent test results  Airway Mallampati: I  TM Distance: >3 FB Neck ROM: Full    Dental   Pulmonary former smoker,    Pulmonary exam normal        Cardiovascular Normal cardiovascular exam     Neuro/Psych Anxiety Depression Bipolar Disorder    GI/Hepatic   Endo/Other    Renal/GU      Musculoskeletal   Abdominal   Peds  Hematology   Anesthesia Other Findings   Reproductive/Obstetrics                             Anesthesia Physical Anesthesia Plan  ASA: III  Anesthesia Plan: General   Post-op Pain Management:    Induction: Intravenous  PONV Risk Score and Plan: 3 and Ondansetron, Dexamethasone and Midazolam  Airway Management Planned: Oral ETT  Additional Equipment:   Intra-op Plan:   Post-operative Plan: Extubation in OR  Informed Consent: I have reviewed the patients History and Physical, chart, labs and discussed the procedure including the risks, benefits and alternatives for the proposed anesthesia with the patient or authorized representative who has indicated his/her understanding and acceptance.     Plan Discussed with: CRNA and Surgeon  Anesthesia Plan Comments:         Anesthesia Quick Evaluation

## 2018-04-13 NOTE — Anesthesia Procedure Notes (Signed)

## 2018-04-14 ENCOUNTER — Encounter (HOSPITAL_COMMUNITY): Payer: Self-pay | Admitting: Orthopedic Surgery

## 2018-04-14 DIAGNOSIS — G894 Chronic pain syndrome: Secondary | ICD-10-CM | POA: Diagnosis not present

## 2018-04-14 MED ORDER — OXYCODONE-ACETAMINOPHEN 10-325 MG PO TABS: 1.0000 | ORAL_TABLET | ORAL | 0 refills | Status: AC | PRN

## 2018-04-14 MED FILL — Thrombin (Recombinant) For Soln 20000 Unit: CUTANEOUS | Qty: 1 | Status: AC

## 2018-04-14 NOTE — Progress Notes (Signed)
Physical Therapy Treatment Patient Details Name: Vicki Shelton MRN: [id number] DOB: Dec 21, 1980 Today's Date: 04/14/2018    History of Present Illness Vicki Shelton is a 37 y/o female s/p insertion of a lumbar spinal cord stimulator. PMH includes tobacco use, chornic pain, anxiety, and depression.    PT Comments    Pt progressing towards physical therapy goals. Continues to mobilize slow and guarded, but able to attempt [facility] by end of session. Anticipate pt will progress quickly with post-op mobility and that [facility] would be most appropriate. Will leave choice of RW vs SPC to pt at d/c. Will continue to follow.    Follow Up Recommendations  No PT follow up     Equipment Recommendations  Rolling walker with 5" wheels(vs SPC)    Recommendations for Other Services       Precautions / Restrictions Precautions Precautions: Back;Fall Precaution Booklet Issued: Yes (comment) Precaution Comments: Reviewed precautions verbally during functional mobility Restrictions Weight Bearing Restrictions: No    Mobility  Bed Mobility Overal bed mobility: Needs Assistance Bed Mobility: Rolling;Sidelying to Sit Rolling: Supervision Sidelying to sit: Supervision       General bed mobility comments: HOB flat and rails lowered to simulate home environment. Pt was able to transition to EOB with log roll technique, however required VC's for proper form.   Transfers Overall transfer level: Needs assistance Equipment used: Rolling walker (2 wheeled) Transfers: Sit to/from Stand Sit to Stand: Supervision         General transfer comment: Pt demonstrated proper hand placement on seated surface fotr safety. Increased time to power-up to full stand however no assist was required.   Ambulation/Gait Ambulation/Gait assistance: Min guard;Supervision Gait Distance (Feet): 250 Feet Assistive device: Rolling walker (2 wheeled);Straight cane Gait Pattern/deviations: Step-through pattern;Decreased  stride length;Trunk flexed Gait velocity: Decreased Gait velocity interpretation: <1.31 ft/sec, indicative of household ambulator General Gait Details: Slow and guarded however overall steady with UE support. Progressed to [facility] use by end of session however pain was reportedly slightly increased.    Stairs Stairs: Yes Stairs assistance: Min guard;Supervision Stair Management: One rail Right;Step to pattern;Sideways Number of Stairs: 10 General stair comments: Light min guard to close supervision provided for safety. Pt was able to sequence well with sideways negotiation of stairs.    Wheelchair Mobility    Modified Rankin (Stroke Patients Only)       Balance Overall balance assessment: Needs assistance Sitting-balance support: Bilateral upper extremity supported;Feet supported Sitting balance-Leahy Scale: Fair     Standing balance support: Single extremity supported;Bilateral upper extremity supported;No upper extremity supported Standing balance-Leahy Scale: Poor Standing balance comment: Reaching out for at least 1 UE support for dynamic activity                            Cognition Arousal/Alertness: Awake/alert Behavior During Therapy: Flat affect Overall Cognitive Status: Within Functional Limits for tasks assessed                                        Exercises      General Comments        Pertinent Vitals/Pain Pain Assessment: Faces Faces Pain Scale: Hurts even more Pain Location: Back Pain Descriptors / Indicators: Grimacing;Guarding Pain Intervention(s): Monitored during session    Home Living  Prior Function            PT Goals (current goals can now be found in the care plan section) Acute Rehab PT Goals Patient Stated Goal: "Move easier without pain" PT Goal Formulation: With patient Time For Goal Achievement: 04/27/18 Potential to Achieve Goals: Fair Progress towards PT goals:  Progressing toward goals    Frequency    Min 5X/week      PT Plan Current plan remains appropriate    Co-evaluation              AM-PAC PT "6 Clicks" Daily Activity  Outcome Measure  Difficulty turning over in bed (including adjusting bedclothes, sheets and blankets)?: None Difficulty moving from lying on back to sitting on the side of the bed? : A Little Difficulty sitting down on and standing up from a chair with arms (e.g., wheelchair, bedside commode, etc,.)?: A Little Help needed moving to and from a bed to chair (including a wheelchair)?: A Little Help needed walking in hospital room?: A Little Help needed climbing 3-5 steps with a railing? : A Little 6 Click Score: 19    End of Session Equipment Utilized During Treatment: Gait belt Activity Tolerance: Patient limited by pain Patient left: in bed;with call bell/phone within reach;with family/visitor present Nurse Communication: Mobility status PT Visit Diagnosis: Unsteadiness on feet (R26.81);Other abnormalities of gait and mobility (R26.89);Muscle weakness (generalized) (M62.81);Pain;Difficulty in walking, not elsewhere classified (R26.2) Pain - part of body: (Back)     Time: [id number] PT Time Calculation (min) (ACUTE ONLY): 26 min  Charges:  $Gait Training: 23-37 mins                    G Codes:       Conni SlipperLaura Dhruvan Gullion, PT, DPT Acute Rehabilitation Services Pager: [phone]    Marylynn PearsonLaura D Fanny Agan 04/14/2018, 9:47 AM

## 2018-04-14 NOTE — Progress Notes (Signed)
    Subjective: 1 Day Post-Op Procedure(s) (LRB): LUMBAR SPINAL CORD STIMULATOR INSERTION (N/A) Patient reports pain as 7 on 0-10 scale.   Denies CP or SOB.  Voiding without difficulty. Positive flatus. Objective: Vital signs in last 24 hours: Temp:  [97.3 F (36.3 C)-98.5 F (36.9 C)] 98.5 F (36.9 C) (07/19 0320) Pulse Rate:  [61-84] 80 (07/19 0717) Resp:  [11-19] 16 (07/19 0717) BP: (102-136)/(57-86) 124/79 (07/19 0717) SpO2:  [94 %-100 %] 100 % (07/19 0717)  Intake/Output from previous day: 07/18 0701 - 07/19 0700 In: 1929.6 [P.O.:340; I.V.:1192.1; IV Piggyback:197.6] Out: 30 [Blood:30] Intake/Output this shift: No intake/output data recorded.  Labs: No results for input(s): HGB in the last 72 hours. No results for input(s): WBC, RBC, HCT, PLT in the last 72 hours. No results for input(s): NA, K, CL, CO2, BUN, CREATININE, GLUCOSE, CALCIUM in the last 72 hours. No results for input(s): LABPT, INR in the last 72 hours.  Physical Exam: Neurologically intact ABD soft Sensation intact distally Dorsiflexion/Plantar flexion intact Incision: no drainage Compartment soft Body mass index is 37.86 kg/m.   Assessment/Plan: 1 Day Post-Op Procedure(s) (LRB): LUMBAR SPINAL CORD STIMULATOR INSERTION (N/A) Advance diet Up with therapy  Pt needs to meet with [employer] rep Pt needs to be cleared by PT Meds on chart Pt may DC after cleared by PT  [facility], Baxter Kailarmen Christina for Dr[facility] Venita Lickahari [facility] [phone] 04/14/2018, 7:25 AM

## 2018-04-14 NOTE — Evaluation (Signed)
Occupational Therapy Evaluation Patient Details Name: Vicki Shelton MRN: [id number] DOB: 1980/10/10 Today's Date: 04/14/2018    History of Present Illness Vicki Shelton is a 37 y/o female s/p insertion of a lumbar spinal cord stimulator. PMH includes tobacco use, chornic pain, anxiety, and depression.   Clinical Impression   Patient evaluated by Occupational Therapy with no further acute OT needs identified. All education has been completed and the patient has no further questions. All education completed.  Pt able to perform ADLs with supervision using AE.   See below for any follow-up Occupational Therapy or equipment needs. OT is signing off. Thank you for this referral.      Follow Up Recommendations  No OT follow up;Supervision/Assistance - 24 hour    Equipment Recommendations  None recommended by OT    Recommendations for Other Services       Precautions / Restrictions Precautions Precautions: Back;Fall Precaution Booklet Issued: Yes (comment) Precaution Comments: reviewed back precautions with pt.  She was able to verbalize understanding of all  Restrictions Weight Bearing Restrictions: No      Mobility Bed Mobility Overal bed mobility: Modified Independent Bed Mobility: Rolling;Sidelying to Sit Rolling: Supervision Sidelying to sit: Supervision       General bed mobility comments: HOB flat and rails lowered to simulate home environment. Pt was able to transition to EOB with log roll technique, however required VC's for proper form.   Transfers Overall transfer level: Needs assistance Equipment used: Straight cane Transfers: Sit to/from [employer] Transfers Sit to Stand: Supervision Stand pivot transfers: Supervision       General transfer comment: Pt demonstrated proper hand placement on seated surface fotr safety. Increased time to power-up to full stand however no assist was required.     Balance Overall balance assessment: Needs  assistance Sitting-balance support: Bilateral upper extremity supported;Feet supported Sitting balance-Leahy Scale: Fair     Standing balance support: Single extremity supported;Bilateral upper extremity supported;No upper extremity supported Standing balance-Leahy Scale: Poor Standing balance comment: Reaching out for at least 1 UE support for dynamic activity                           ADL either performed or assessed with clinical judgement   ADL Overall ADL's : Needs assistance/impaired Eating/Feeding: Independent   Grooming: Wash/dry hands;Wash/dry face;Oral care;Brushing hair;Supervision/safety;Standing Grooming Details (indicate cue type and reason): reviewed safe technique for grooming to avoid bending  Upper Body Bathing: Supervision/ safety;Sitting;Standing   Lower Body Bathing: Sit to/from stand;Supervison/ safety Lower Body Bathing Details (indicate cue type and reason): Pt unable to cross ankles over knees.  She was instructed in use of LH bath sponge  Upper Body Dressing : Supervision/safety;Sitting   Lower Body Dressing: Supervision/safety;Min guard;Sit to/from stand Lower Body Dressing Details (indicate cue type and reason): Pt instructed in use of AE for LB ADLs and able to demonstrate understanding  Toilet Transfer: Supervision/safety;Ambulation;Comfort height toilet Toilet Transfer Details (indicate cue type and reason): Pt has toilet riser.  Discussed options for adding rails if needed.  She verbalized understanding of where to acquire  Toileting- Architect and Hygiene: Supervision/safety;Sit to/from stand   Tub/ Shower Transfer: Tub transfer;Min guard;Minimal Radiation protection practitioner Details (indicate cue type and reason): Pt has 2 steps into shower.  Instructed her to continue to have dtr assist  Functional mobility during ADLs: Supervision/safety General ADL Comments: Pt instructed in safe techniques for  ADLs and IADLs  Vision         Perception     Praxis      Pertinent Vitals/Pain Pain Assessment: 0-10 Pain Score: 7  Faces Pain Scale: Hurts even more Pain Location: Back Pain Descriptors / Indicators: Grimacing;Guarding Pain Intervention(s): Monitored during session;Repositioned     Hand Dominance     Extremity/Trunk Assessment Upper Extremity Assessment Upper Extremity Assessment: Overall WFL for tasks assessed   Lower Extremity Assessment Lower Extremity Assessment: Defer to PT evaluation       Communication Communication Communication: No difficulties   Cognition Arousal/Alertness: Awake/alert Behavior During Therapy: Flat affect Overall Cognitive Status: Within Functional Limits for tasks assessed                                     General Comments       Exercises     Shoulder Instructions      Home Living Family/patient expects to be discharged to:: Private residence Living Arrangements: Children Available Help at Discharge: Family;Available PRN/intermittently Type of Home: Mobile home Home Access: Stairs to enter   Entrance Stairs-Rails: Right;Left Home Layout: One level     Bathroom Shower/Tub: Chief Strategy OfficerTub/shower unit   Bathroom Toilet: Standard Bathroom Accessibility: Yes   Home Equipment: Toilet riser   Additional Comments: (Pt reports children ages 4210 and 15 assist her )      Prior Functioning/Environment Level of Independence: Needs assistance  Gait / Transfers Assistance Needed: independent  ADL's / Homemaking Assistance Needed: Pt reports daughter has to assist her with bathing, tub transfer, and dressing.  Pt works for [employer]             OT Problem List: Pain      OT Treatment/Interventions:      OT Goals(Current goals can be found in the care plan section) Acute Rehab OT Goals Patient Stated Goal: To be able to dress and bathe self  OT Goal Formulation: All assessment and education complete, DC therapy  OT Frequency:      Barriers to D/C:            Co-evaluation              AM-PAC PT "6 Clicks" Daily Activity     Outcome Measure Help from another person eating meals?: None Help from another person taking care of personal grooming?: None Help from another person toileting, which includes using toliet, bedpan, or urinal?: None Help from another person bathing (including washing, rinsing, drying)?: A Little Help from another person to put on and taking off regular upper body clothing?: None Help from another person to put on and taking off regular lower body clothing?: None 6 Click Score: 23   End of Session Nurse Communication: Mobility status  Activity Tolerance: Patient limited by pain Patient left: in bed;with call bell/phone within reach  OT Visit Diagnosis: Pain Pain - part of body: (back )                Time: 2956-21300916-0930 OT Time Calculation (min): 14 min Charges:  OT General Charges $OT Visit: 1 Visit OT Evaluation $OT Eval Low Complexity: 1 Low G-Codes:     Reynolds AmericanWendi Shondell Fabel, OTR/L [phone]   Jeani HawkingConarpe, Kallon Caylor M 04/14/2018, 10:27 AM

## 2018-04-14 NOTE — Progress Notes (Signed)
Patient is discharged from room 3C07 at this time. Alert and in stable condittion. IV site d/c'd and instructions read to patient and family with understanding verbalized. Left unit via wheelchair with all belongings at side.

## 2018-04-14 NOTE — Discharge Summary (Signed)
Physician Discharge Summary  Patient ID: Vicki Shelton MRN: [id number] DOB/AGE: [birth year] 37 y.o.  Admit date: 04/13/2018 Discharge date: 04/14/2018  Admission Diagnoses:  Chronic pain syndrome  Discharge Diagnoses:  Active Problems:   S/P insertion of spinal cord stimulator   Past Medical History:  Diagnosis Date  . Anemia    HX BLOOD TRANSFUSION    . Anxiety   . Depression   . Family history of adverse reaction to anesthesia    MOTHER HAS  N+V  . Headache    HX MIGRAINES    Surgeries: Procedure(s): LUMBAR SPINAL CORD STIMULATOR INSERTION on 04/13/2018   Consultants (if any):   Discharged Condition: Improved  Hospital Course: Vicki Shelton is an 37 y.o. female who was admitted 04/13/2018 with a diagnosis of Chronic pain syndrome and went to the operating room on 04/13/2018 and underwent the above named procedures.  Post op day 1 pt reports moderate incisional; pain.  She has been ambulating in the hallway with a  Rolling walker. Pt is voiding w/o difficulty. Pt was cleared by PT for DC.  Pt met with [employer] rep before DC.   She was given perioperative antibiotics:  Anti-infectives (From admission, onward)   Start     Dose/Rate Route Frequency Ordered Stop   04/13/18 1530  ceFAZolin (ANCEF) IVPB 2g/100 mL premix     2 g 200 mL/hr over 30 Minutes Intravenous Every 8 hours 04/13/18 1148 04/14/18 0017   04/13/18 0546  ceFAZolin (ANCEF) 2-4 GM/100ML-% IVPB    Note to Pharmacy:  Barbie Haggis   : cabinet override      04/13/18 0546 04/13/18 0745   04/13/18 0544  ceFAZolin (ANCEF) IVPB 2g/100 mL premix     2 g 200 mL/hr over 30 Minutes Intravenous 30 min pre-op 04/13/18 0544 04/13/18 0745    .  She was given sequential compression devices, early ambulation, and TED for DVT prophylaxis.  She benefited maximally from the hospital stay and there were no complications.    Recent vital signs:  Vitals:   04/14/18 0717 04/14/18 1203  BP: 124/79 139/85  Pulse: 80 81   Resp: 16 16  Temp:  98.7 F (37.1 C)  SpO2: 100% 100%    Recent laboratory studies:  Lab Results  Component Value Date   HGB 12.4 04/03/2018   HGB 13.6 04/04/2010   HGB 11.9 (L) 07/31/2008   Lab Results  Component Value Date   WBC 6.6 04/03/2018   PLT 245 04/03/2018   No results found for: INR Lab Results  Component Value Date   NA 137 04/04/2010   K 3.6 04/04/2010   CL 103 04/04/2010   CO2 28 04/04/2010   BUN 12 04/04/2010   CREATININE 0.78 04/04/2010   GLUCOSE 93 04/04/2010    Discharge Medications:   Allergies as of 04/14/2018   No Known Allergies     Medication List    STOP taking these medications   HYDROcodone-acetaminophen 10-325 MG tablet Commonly known as:  NORCO     TAKE these medications   methocarbamol 500 MG tablet Commonly known as:  ROBAXIN Take 1 tablet (500 mg total) by mouth 3 (three) times daily.   ondansetron 4 MG disintegrating tablet Commonly known as:  ZOFRAN ODT Take 1 tablet (4 mg total) by mouth every 8 (eight) hours as needed for nausea or vomiting.   oxyCODONE-acetaminophen 10-325 MG tablet Commonly known as:  PERCOCET Take 1 tablet by mouth every 4 (four) hours as needed for  up to 5 days for pain (pt may take 1/2 to 1 tablet q4 severe post op pain).       Diagnostic Studies: Dg Thoracic Spine 2 View  Result Date: 04/13/2018 CLINICAL DATA:  Insertion of spinal cord stimulator EXAM: THORACIC SPINE 2 VIEWS COMPARISON:  CT lumbar spine of 05/18/2016 FINDINGS: Two C-arm spot films were returned. These demonstrate placement of spinal cord stimulator with the leads lying at approximately the T9-10 level although the limited field of view makes exact location difficult. IMPRESSION: Stimulator leads probably at the T9-10 level as noted above. Electronically Signed   By: Ivar Drape M.D.   On: 04/13/2018 12:07   Dg C-arm 1-60 Min  Result Date: 04/13/2018 CLINICAL DATA:  Insertion of spinal cord stimulator. EXAM: DG C-ARM 61-120 MIN  COMPARISON:  CT lumbar spine of 05/18/2016 FINDINGS: C-arm fluoroscopy was provided during insertion of spinal cord stimulator. Fluoroscopy time of 26 seconds was recorded. IMPRESSION: C-arm fluoroscopy provided. Electronically Signed   By: Ivar Drape M.D.   On: 04/13/2018 12:06   Dg C-arm 1-60 Min  Result Date: 04/13/2018 CLINICAL DATA:  Spinal cord stimulator insertion EXAM: DG C-ARM 61-120 MIN COMPARISON:  CT lumbar spine of 05/18/2016 FINDINGS: C-arm fluoroscopy was provided during insertion of spinal cord stimulator. Fluoroscopy time of 26 seconds was recorded. IMPRESSION: C-arm fluoroscopy provided. Electronically Signed   By: Ivar Drape M.D.   On: 04/13/2018 12:05    Disposition: Discharge disposition: 01-Home or Self Care      Pt will present to clinic in 2 weeks Post op meds provided  Discharge Instructions    Incentive spirometry RT   Complete by:  As directed       Follow-up Information    Melina Schools, MD Follow up in 2 week(s).   Specialty:  Orthopedic Surgery Contact information: [address] [place] [address] [phone]            Signed: Valinda Hoar 04/14/2018, 3:36 PM

## 2018-12-05 ENCOUNTER — Other Ambulatory Visit: Payer: Self-pay | Admitting: Orthopedic Surgery

## 2018-12-05 DIAGNOSIS — M545 Low back pain, unspecified: Secondary | ICD-10-CM

## 2018-12-05 DIAGNOSIS — G8929 Other chronic pain: Secondary | ICD-10-CM

## 2018-12-06 ENCOUNTER — Other Ambulatory Visit: Payer: Self-pay | Admitting: Orthopedic Surgery

## 2018-12-06 DIAGNOSIS — M5416 Radiculopathy, lumbar region: Secondary | ICD-10-CM

## 2019-01-05 ENCOUNTER — Other Ambulatory Visit: Payer: Self-pay

## 2019-03-09 ENCOUNTER — Other Ambulatory Visit: Payer: BLUE CROSS/BLUE SHIELD

## 2019-03-21 ENCOUNTER — Other Ambulatory Visit: Payer: Self-pay | Admitting: Orthopedic Surgery

## 2019-03-23 ENCOUNTER — Other Ambulatory Visit: Payer: BLUE CROSS/BLUE SHIELD

## 2019-03-23 ENCOUNTER — Ambulatory Visit
Admission: RE | Admit: 2019-03-23 | Discharge: 2019-03-23 | Disposition: A | Discharge status: Home / Self Care | Payer: BC Managed Care – PPO | Source: Ambulatory Visit | Attending: Orthopedic Surgery | Admitting: Orthopedic Surgery

## 2019-03-23 ENCOUNTER — Other Ambulatory Visit: Payer: Self-pay

## 2019-03-23 DIAGNOSIS — M545 Low back pain, unspecified: Secondary | ICD-10-CM

## 2019-03-23 DIAGNOSIS — G8929 Other chronic pain: Secondary | ICD-10-CM

## 2021-02-10 ENCOUNTER — Other Ambulatory Visit: Payer: Self-pay | Admitting: Orthopedic Surgery

## 2021-02-10 DIAGNOSIS — M25561 Pain in right knee: Secondary | ICD-10-CM

## 2021-04-14 ENCOUNTER — Inpatient Hospital Stay: Admission: RE | Admit: 2021-04-14 | Payer: BC Managed Care – PPO | Source: Ambulatory Visit

## 2021-05-07 ENCOUNTER — Ambulatory Visit
Admission: RE | Admit: 2021-05-07 | Discharge: 2021-05-07 | Disposition: A | Discharge status: Home / Self Care | Payer: 59 | Source: Ambulatory Visit | Attending: Orthopedic Surgery | Admitting: Orthopedic Surgery

## 2021-05-07 ENCOUNTER — Other Ambulatory Visit: Payer: Self-pay

## 2021-05-07 DIAGNOSIS — M25561 Pain in right knee: Secondary | ICD-10-CM

## 2021-05-07 MED ORDER — IOPAMIDOL (ISOVUE-M 200) INJECTION 41%
35.0000 mL | Freq: Once | INTRAMUSCULAR | Status: AC
  Administered 2021-05-07: 35 mL via INTRA_ARTICULAR
